# Patient Record
Sex: Female | Born: 1960 | Race: White | Hispanic: Yes | Marital: Married | Smoking: Never smoker
Health system: Southern US, Community
[De-identification: ages and names within clinical notes are randomized; demographics above are authoritative.]

## PROBLEM LIST (undated history)

## (undated) DIAGNOSIS — Z87442 Personal history of urinary calculi: Secondary | ICD-10-CM

## (undated) DIAGNOSIS — Z8742 Personal history of other diseases of the female genital tract: Secondary | ICD-10-CM

## (undated) DIAGNOSIS — K219 Gastro-esophageal reflux disease without esophagitis: Secondary | ICD-10-CM

## (undated) DIAGNOSIS — N200 Calculus of kidney: Secondary | ICD-10-CM

## (undated) DIAGNOSIS — Z9889 Other specified postprocedural states: Secondary | ICD-10-CM

## (undated) DIAGNOSIS — E785 Hyperlipidemia, unspecified: Secondary | ICD-10-CM

## (undated) DIAGNOSIS — R7309 Other abnormal glucose: Secondary | ICD-10-CM

## (undated) DIAGNOSIS — H409 Unspecified glaucoma: Secondary | ICD-10-CM

## (undated) HISTORY — DX: Personal history of urinary calculi: Z87.442

## (undated) HISTORY — DX: Other abnormal glucose: R73.09

## (undated) HISTORY — DX: Other specified postprocedural states: Z98.890

## (undated) HISTORY — DX: Unspecified glaucoma: H40.9

## (undated) HISTORY — PX: FRACTURE SURGERY: SHX138

## (undated) HISTORY — PX: NO PAST SURGERIES: SHX2092

## (undated) HISTORY — DX: Personal history of other diseases of the female genital tract: Z87.42

---

## 2005-03-05 ENCOUNTER — Other Ambulatory Visit: Admission: RE | Admit: 2005-03-05 | Discharge: 2005-03-05 | Payer: Self-pay | Admitting: Obstetrics and Gynecology

## 2011-10-05 ENCOUNTER — Emergency Department (HOSPITAL_BASED_OUTPATIENT_CLINIC_OR_DEPARTMENT_OTHER)
Admission: EM | Admit: 2011-10-05 | Discharge: 2011-10-05 | Disposition: A | Discharge status: Home / Self Care | Payer: BC Managed Care – PPO | Attending: Emergency Medicine | Admitting: Emergency Medicine

## 2011-10-05 ENCOUNTER — Encounter (HOSPITAL_BASED_OUTPATIENT_CLINIC_OR_DEPARTMENT_OTHER): Payer: Self-pay | Admitting: *Deleted

## 2011-10-05 ENCOUNTER — Emergency Department (INDEPENDENT_AMBULATORY_CARE_PROVIDER_SITE_OTHER): Payer: BC Managed Care – PPO

## 2011-10-05 DIAGNOSIS — N200 Calculus of kidney: Secondary | ICD-10-CM

## 2011-10-05 DIAGNOSIS — K219 Gastro-esophageal reflux disease without esophagitis: Secondary | ICD-10-CM | POA: Insufficient documentation

## 2011-10-05 DIAGNOSIS — N23 Unspecified renal colic: Secondary | ICD-10-CM

## 2011-10-05 DIAGNOSIS — N201 Calculus of ureter: Secondary | ICD-10-CM

## 2011-10-05 DIAGNOSIS — R109 Unspecified abdominal pain: Secondary | ICD-10-CM

## 2011-10-05 DIAGNOSIS — R112 Nausea with vomiting, unspecified: Secondary | ICD-10-CM | POA: Insufficient documentation

## 2011-10-05 DIAGNOSIS — E785 Hyperlipidemia, unspecified: Secondary | ICD-10-CM | POA: Insufficient documentation

## 2011-10-05 DIAGNOSIS — K449 Diaphragmatic hernia without obstruction or gangrene: Secondary | ICD-10-CM

## 2011-10-05 HISTORY — DX: Gastro-esophageal reflux disease without esophagitis: K21.9

## 2011-10-05 HISTORY — DX: Hyperlipidemia, unspecified: E78.5

## 2011-10-05 LAB — CBC
Hemoglobin: 12.6 g/dL (ref 12.0–15.0)
MCH: 29.6 pg (ref 26.0–34.0)
MCV: 86.4 fL (ref 78.0–100.0)
Platelets: 197 10*3/uL (ref 150–400)
RBC: 4.25 MIL/uL (ref 3.87–5.11)
WBC: 10.1 10*3/uL (ref 4.0–10.5)

## 2011-10-05 LAB — COMPREHENSIVE METABOLIC PANEL
ALT: 19 U/L (ref 0–35)
Alkaline Phosphatase: 60 U/L (ref 39–117)
BUN: 19 mg/dL (ref 6–23)
CO2: 26 mEq/L (ref 19–32)
GFR calc Af Amer: 75 mL/min — ABNORMAL LOW (ref >90)
GFR calc non Af Amer: 65 mL/min — ABNORMAL LOW (ref >90)
Glucose, Bld: 124 mg/dL — ABNORMAL HIGH (ref 70–99)
Potassium: 3.6 mEq/L (ref 3.5–5.1)
Sodium: 141 mEq/L (ref 135–145)

## 2011-10-05 LAB — LIPASE, BLOOD: Lipase: 27 U/L (ref 11–59)

## 2011-10-05 LAB — DIFFERENTIAL
Eosinophils Relative: 1 % (ref 0–5)
Lymphocytes Relative: 16 % (ref 12–46)
Lymphs Abs: 1.6 10*3/uL (ref 0.7–4.0)
Monocytes Relative: 6 % (ref 3–12)
Neutrophils Relative %: 77 % (ref 43–77)

## 2011-10-05 LAB — URINALYSIS, ROUTINE W REFLEX MICROSCOPIC
Bilirubin Urine: NEGATIVE
Glucose, UA: NEGATIVE mg/dL
Hgb urine dipstick: NEGATIVE
Urobilinogen, UA: 0.2 mg/dL (ref 0.0–1.0)
pH: 7.5 (ref 5.0–8.0)

## 2011-10-05 MED ORDER — ONDANSETRON HCL 4 MG/2ML IJ SOLN
4.0000 mg | Freq: Once | INTRAMUSCULAR | Status: AC
  Administered 2011-10-05: 4 mg via INTRAVENOUS

## 2011-10-05 MED ORDER — ONDANSETRON HCL 4 MG/2ML IJ SOLN
INTRAMUSCULAR | Status: AC
  Administered 2011-10-05: 4 mg via INTRAVENOUS
  Filled 2011-10-05: qty 2

## 2011-10-05 MED ORDER — ONDANSETRON 8 MG PO TBDP: 8.0000 mg | ORAL_TABLET | Freq: Three times a day (TID) | ORAL | Status: AC | PRN

## 2011-10-05 MED ORDER — IBUPROFEN 600 MG PO TABS: 600.0000 mg | ORAL_TABLET | Freq: Four times a day (QID) | ORAL | Status: AC | PRN

## 2011-10-05 MED ORDER — HYDROMORPHONE HCL PF 1 MG/ML IJ SOLN
1.0000 mg | Freq: Once | INTRAMUSCULAR | Status: AC
  Administered 2011-10-05: 1 mg via INTRAVENOUS

## 2011-10-05 MED ORDER — SODIUM CHLORIDE 0.9 % IV BOLUS (SEPSIS)
1000.0000 mL | Freq: Once | INTRAVENOUS | Status: AC
  Administered 2011-10-05: 1000 mL via INTRAVENOUS

## 2011-10-05 MED ORDER — ONDANSETRON HCL 4 MG/2ML IJ SOLN
4.0000 mg | Freq: Once | INTRAMUSCULAR | Status: AC
  Administered 2011-10-05: 4 mg via INTRAVENOUS
  Filled 2011-10-05: qty 2

## 2011-10-05 MED ORDER — HYDROMORPHONE HCL PF 1 MG/ML IJ SOLN
INTRAMUSCULAR | Status: AC
  Administered 2011-10-05: 1 mg via INTRAVENOUS
  Filled 2011-10-05: qty 1

## 2011-10-05 MED ORDER — OXYCODONE-ACETAMINOPHEN 5-325 MG PO TABS
1.0000 | ORAL_TABLET | Freq: Once | ORAL | Status: AC
  Administered 2011-10-05: 1 via ORAL
  Filled 2011-10-05: qty 1

## 2011-10-05 MED ORDER — OXYCODONE-ACETAMINOPHEN 5-325 MG PO TABS: 1.0000 | ORAL_TABLET | ORAL | Status: AC | PRN

## 2011-10-05 NOTE — ED Notes (Signed)
MD at bedside. 

## 2011-10-05 NOTE — ED Notes (Signed)
Pt ambulated to restroom without assist, specimen obtained.

## 2011-10-05 NOTE — ED Notes (Signed)
Pt ambulated to restroom per request.

## 2011-10-05 NOTE — ED Notes (Signed)
Pt c/o right side abdominal pain that came on all of a sudden 3 hours ago. Pt also c/o N/V but no diarrhea.

## 2011-10-05 NOTE — ED Provider Notes (Signed)
History     CSN: [id number]  Arrival date & time 10/05/11  0145   First MD Initiated Contact with Patient 10/05/11 0206      Chief Complaint  Patient presents with  . Abdominal Pain     Patient is a 51 y.o. female presenting with abdominal pain. The history is provided by the patient.  Abdominal Pain The primary symptoms of the illness include abdominal pain, nausea and vomiting. The primary symptoms of the illness do not include fever, diarrhea or vaginal bleeding. The current episode started 3 to 5 hours ago. The onset of the illness was sudden. The problem has been rapidly worsening.  Additional symptoms associated with the illness include chills, frequency and back pain.  Pt reports she was in bed when this pain occurred She has never had this pain before She denies h/o abd surgeries No cp/sob reported No fever reported The pain has been constant  Past Medical History  Diagnosis Date  . GERD (gastroesophageal reflux disease)   . Hyperlipidemia     History reviewed. No pertinent past surgical history.  No family history on file.  History  Substance Use Topics  . Smoking status: Never Smoker   . Smokeless tobacco: Not on file  . Alcohol Use: No    OB History    Grav Para Term Preterm Abortions TAB SAB Ect Mult Living                  Review of Systems  Constitutional: Positive for chills. Negative for fever.  Gastrointestinal: Positive for nausea, vomiting and abdominal pain. Negative for diarrhea.  Genitourinary: Positive for frequency. Negative for vaginal bleeding.  Musculoskeletal: Positive for back pain.  All other systems reviewed and are negative.    Allergies  Review of patient's allergies indicates no known allergies.  Home Medications   Current Outpatient Rx  Name Route Sig Dispense Refill  . ESOMEPRAZOLE MAGNESIUM 20 MG PO CPDR Oral Take 20 mg by mouth daily before breakfast.    . SIMVASTATIN 20 MG PO TABS Oral Take 20 mg by mouth every  evening.      BP 121/76  Pulse 72  Temp(Src) 98 F (36.7 C) (Oral)  Resp 20  SpO2 100%  Physical Exam CONSTITUTIONAL: Well developed/well nourished, uncomfortable appearing HEAD AND FACE: Normocephalic/atraumatic EYES: EOMI/PERRL, no scleral icterus ENMT: Mucous membranes moist NECK: supple no meningeal signs SPINE:entire spine nontender CV: S1/S2 noted, no murmurs/rubs/gallops noted LUNGS: Lungs are clear to auscultation bilaterally, no apparent distress ABDOMEN: soft, no rebound, no guarding is noted, tender in right mid abdomen, negative murphys sign GU:no cva tenderness NEURO: Pt is awake/alert, moves all extremitiesx4 EXTREMITIES: pulses normal, full ROM SKIN: warm, color normal PSYCH: no abnormalities of mood noted  ED Course  Procedures   Labs Reviewed  URINALYSIS, ROUTINE W REFLEX MICROSCOPIC - Abnormal; Notable for the following:    APPearance TURBID (*)    Ketones, ur 15 (*)    Leukocytes, UA SMALL (*)    All other components within normal limits  COMPREHENSIVE METABOLIC PANEL - Abnormal; Notable for the following:    Glucose, Bld 124 (*)    Total Bilirubin 0.2 (*)    GFR calc non Af Amer 65 (*)    GFR calc Af Amer 75 (*)    All other components within normal limits  URINE MICROSCOPIC-ADD ON - Abnormal; Notable for the following:    Squamous Epithelial / LPF FEW (*)    All other components within normal  limits  CBC  DIFFERENTIAL  LIPASE, BLOOD  PREGNANCY, URINE   3:56 AM After pain meds, pt now localizes pain to right flank with some radiation to lower abd Will get ct imaging  5:44 AM Pt improved ,taking PO, ambulatory Discussed course of illness Given urology followup Stable for d/c BP 103/63  Pulse 72  Temp(Src) 98 F (36.7 C) (Oral)  Resp 20  SpO2 98%   MDM  Nursing notes reviewed and considered in documentation All labs/vitals reviewed and considered         Joya Gaskins, MD 10/05/11 [phone]

## 2011-10-05 NOTE — ED Notes (Signed)
Patient transported to CT 

## 2011-10-05 NOTE — ED Notes (Signed)
Pt made aware that we need a urine sample.

## 2012-10-15 ENCOUNTER — Emergency Department (HOSPITAL_BASED_OUTPATIENT_CLINIC_OR_DEPARTMENT_OTHER): Payer: BC Managed Care – PPO

## 2012-10-15 ENCOUNTER — Emergency Department (HOSPITAL_BASED_OUTPATIENT_CLINIC_OR_DEPARTMENT_OTHER)
Admission: EM | Admit: 2012-10-15 | Discharge: 2012-10-15 | Disposition: A | Discharge status: Home / Self Care | Payer: BC Managed Care – PPO | Attending: Emergency Medicine | Admitting: Emergency Medicine

## 2012-10-15 ENCOUNTER — Encounter (HOSPITAL_BASED_OUTPATIENT_CLINIC_OR_DEPARTMENT_OTHER): Payer: Self-pay | Admitting: *Deleted

## 2012-10-15 DIAGNOSIS — Y929 Unspecified place or not applicable: Secondary | ICD-10-CM | POA: Insufficient documentation

## 2012-10-15 DIAGNOSIS — Z79899 Other long term (current) drug therapy: Secondary | ICD-10-CM | POA: Insufficient documentation

## 2012-10-15 DIAGNOSIS — E785 Hyperlipidemia, unspecified: Secondary | ICD-10-CM | POA: Insufficient documentation

## 2012-10-15 DIAGNOSIS — W108XXA Fall (on) (from) other stairs and steps, initial encounter: Secondary | ICD-10-CM | POA: Insufficient documentation

## 2012-10-15 DIAGNOSIS — K219 Gastro-esophageal reflux disease without esophagitis: Secondary | ICD-10-CM | POA: Insufficient documentation

## 2012-10-15 DIAGNOSIS — S42409A Unspecified fracture of lower end of unspecified humerus, initial encounter for closed fracture: Secondary | ICD-10-CM | POA: Insufficient documentation

## 2012-10-15 DIAGNOSIS — Y939 Activity, unspecified: Secondary | ICD-10-CM | POA: Insufficient documentation

## 2012-10-15 DIAGNOSIS — S42402A Unspecified fracture of lower end of left humerus, initial encounter for closed fracture: Secondary | ICD-10-CM

## 2012-10-15 MED ORDER — HYDROCODONE-ACETAMINOPHEN 5-325 MG PO TABS: 2.0000 | ORAL_TABLET | ORAL | Status: DC | PRN

## 2012-10-15 NOTE — ED Notes (Signed)
Patient transported to X-ray 

## 2012-10-15 NOTE — ED Notes (Signed)
Pt states she tripped this a.m and fell down 4-5 steps, injuring her left elbow. C/O pain to same. Moves fingers. Feels touch. Cap refill < 3 sec. Ring removed and given to husband. Arm placed in sling and ice applied.

## 2012-10-15 NOTE — ED Provider Notes (Signed)
History     CSN: [id number]  Arrival date & time 10/15/12  1103   First MD Initiated Contact with Patient 10/15/12 1207      Chief Complaint  Patient presents with  . Elbow Injury    (Consider location/radiation/quality/duration/timing/severity/associated sxs/prior treatment) Patient is a 52 y.o. female presenting with arm injury. The history is provided by the patient. No language interpreter was used.  Arm Injury Location:  Elbow Time since incident:  3 hours Injury: yes   Mechanism of injury: fall   Fall:    Fall occurred:  Down stairs   Impact surface:  Stairs   Entrapped after fall: no   Elbow location:  L elbow Pain details:    Quality:  Aching   Radiates to:  L elbow   Severity:  Mild   Onset quality:  Sudden   Timing:  Constant   Progression:  Improving Relieved by:  NSAIDs Worsened by:  Nothing tried Associated symptoms: no back pain and no neck pain     Past Medical History  Diagnosis Date  . GERD (gastroesophageal reflux disease)   . Hyperlipidemia     History reviewed. No pertinent past surgical history.  History reviewed. No pertinent family history.  History  Substance Use Topics  . Smoking status: Never Smoker   . Smokeless tobacco: Not on file  . Alcohol Use: No    OB History   Grav Para Term Preterm Abortions TAB SAB Ect Mult Living                  Review of Systems  HENT: Negative for neck pain.   Musculoskeletal: Positive for joint swelling. Negative for back pain.  All other systems reviewed and are negative.    Allergies  Review of patient's allergies indicates no known allergies.  Home Medications   Current Outpatient Rx  Name  Route  Sig  Dispense  Refill  . esomeprazole (NEXIUM) 20 MG capsule   Oral   Take 20 mg by mouth daily before breakfast.         . simvastatin (ZOCOR) 20 MG tablet   Oral   Take 20 mg by mouth every evening.           BP 107/68  Pulse 62  Temp(Src) 97.9 F (36.6 C) (Oral)  Resp  20  Ht 5\' 1"  (1.549 m)  Wt 143 lb (64.864 kg)  BMI 27.03 kg/m2  SpO2 100%  Physical Exam  Nursing note and vitals reviewed. Constitutional: She appears well-developed and well-nourished.  HENT:  Head: Normocephalic.  Eyes: Pupils are equal, round, and reactive to light.  Neck: Normal range of motion.  Cardiovascular: Normal rate.   Pulmonary/Chest: Effort normal.  Musculoskeletal: She exhibits tenderness.  Tender swollen left elbow,  Decreased range of motion,  nv and ns intact  Neurological: She is alert.  Skin: Skin is warm.  Psychiatric: She has a normal mood and affect.    ED Course  Procedures (including critical care time)  Labs Reviewed - No data to display Dg Elbow Complete Left  10/15/2012  *RADIOLOGY REPORT*  Clinical Data: Larey Seat today with posterior elbow pain  LEFT ELBOW - COMPLETE 3+ VIEW  Comparison: None.  Findings: There is a minimally distracted fracture of the medial epicondyle of the distal left humerus with soft tissue swelling. The joint spaces appear normal.  There may be a small left elbow joint effusion present.  IMPRESSION: Slightly distracted fracture of the medial epicondyle.  Small left  elbow joint effusion.   Original Report Authenticated By: Dwyane Dee, M.D.      1. Elbow fracture, left       MDM  Pt counseled on xray,   I advised follow up with Dr. Melvyn Novas for evaluation.   Call tomorrow for appointment.        [place], [place] 10/15/12 1249

## 2012-10-16 ENCOUNTER — Ambulatory Visit
Admission: RE | Admit: 2012-10-16 | Discharge: 2012-10-16 | Disposition: A | Discharge status: Home / Self Care | Payer: BC Managed Care – PPO | Source: Ambulatory Visit | Attending: Orthopedic Surgery | Admitting: Orthopedic Surgery

## 2012-10-16 ENCOUNTER — Other Ambulatory Visit: Payer: Self-pay | Admitting: Orthopedic Surgery

## 2012-10-16 DIAGNOSIS — S42409A Unspecified fracture of lower end of unspecified humerus, initial encounter for closed fracture: Secondary | ICD-10-CM

## 2012-10-16 NOTE — ED Provider Notes (Signed)
Medical screening examination/treatment/procedure(s) were performed by non-physician practitioner and as supervising physician I was immediately available for consultation/collaboration.   Carleene Cooper III, MD 10/16/12 1302

## 2012-10-18 ENCOUNTER — Other Ambulatory Visit: Payer: Self-pay | Admitting: Orthopedic Surgery

## 2012-10-18 ENCOUNTER — Encounter (HOSPITAL_COMMUNITY): Payer: Self-pay

## 2012-10-20 ENCOUNTER — Encounter (HOSPITAL_COMMUNITY)
Admission: RE | Admit: 2012-10-20 | Discharge: 2012-10-20 | Disposition: A | Discharge status: Home / Self Care | Payer: BC Managed Care – PPO | Source: Ambulatory Visit | Attending: Orthopedic Surgery | Admitting: Orthopedic Surgery

## 2012-10-20 ENCOUNTER — Encounter (HOSPITAL_COMMUNITY): Payer: Self-pay

## 2012-10-20 LAB — CBC
HCT: 36 % (ref 36.0–46.0)
Hemoglobin: 12.2 g/dL (ref 12.0–15.0)
MCH: 29.8 pg (ref 26.0–34.0)
MCHC: 33.9 g/dL (ref 30.0–36.0)
RDW: 13 % (ref 11.5–15.5)

## 2012-10-20 LAB — BASIC METABOLIC PANEL
BUN: 15 mg/dL (ref 6–23)
Calcium: 9.7 mg/dL (ref 8.4–10.5)
Chloride: 103 mEq/L (ref 96–112)
Creatinine, Ser: 0.67 mg/dL (ref 0.50–1.10)
GFR calc Af Amer: >90 mL/min (ref >90)

## 2012-10-20 LAB — SURGICAL PCR SCREEN
MRSA, PCR: NEGATIVE
Staphylococcus aureus: NEGATIVE

## 2012-10-20 MED ORDER — CHLORHEXIDINE GLUCONATE 4 % EX LIQD: 60.0000 mL | Freq: Once | CUTANEOUS | Status: DC

## 2012-10-20 MED ORDER — SODIUM CHLORIDE 0.45 % IV SOLN: INTRAVENOUS | Status: DC

## 2012-10-20 MED ORDER — CEFAZOLIN SODIUM-DEXTROSE 2-3 GM-% IV SOLR
2.0000 g | INTRAVENOUS | Status: AC
  Administered 2012-10-21: 2 g via INTRAVENOUS
  Filled 2012-10-20: qty 50

## 2012-10-20 NOTE — Pre-Procedure Instructions (Signed)
Delsa Hilst Shisler  10/20/2012   Your procedure is scheduled on:  10/21/12  Report to [facility] at 730 AM.  Call this number if you have problems the morning of surgery: [phone]   Remember:   Do not eat food or drink liquids after midnight.   Take these medicines the morning of surgery with A SIP OF WATER: none   Do not wear jewelry, make-up or nail polish.  Do not wear lotions, powders, or perfumes. You may wear deodorant.  Do not shave 48 hours prior to surgery. Men may shave face and neck.  Do not bring valuables to the hospital.  Contacts, dentures or bridgework may not be worn into surgery.  Leave suitcase in the car. After surgery it may be brought to your room.  For patients admitted to the hospital, checkout time is 11:00 AM the day of  discharge.   Patients discharged the day of surgery will not be allowed to drive  home.  Name and phone number of your driver: family  Special Instructions: Shower using CHG 2 nights before surgery and the night before surgery.  If you shower the day of surgery use CHG.  Use special wash - you have one bottle of CHG for all showers.  You should use approximately 1/3 of the bottle for each shower.   Please read over the following fact sheets that you were given: Pain Booklet, Coughing and Deep Breathing, MRSA Information and Surgical Site Infection Prevention

## 2012-10-21 ENCOUNTER — Encounter (HOSPITAL_COMMUNITY)
Admission: RE | Disposition: A | Discharge status: Home / Self Care | Payer: Self-pay | Source: Ambulatory Visit | Attending: Orthopedic Surgery

## 2012-10-21 ENCOUNTER — Ambulatory Visit (HOSPITAL_COMMUNITY)
Admission: RE | Admit: 2012-10-21 | Discharge: 2012-10-22 | Disposition: A | Discharge status: Home / Self Care | Payer: BC Managed Care – PPO | Source: Ambulatory Visit | Attending: Orthopedic Surgery | Admitting: Orthopedic Surgery

## 2012-10-21 ENCOUNTER — Ambulatory Visit (HOSPITAL_COMMUNITY): Payer: BC Managed Care – PPO | Admitting: Anesthesiology

## 2012-10-21 ENCOUNTER — Encounter (HOSPITAL_COMMUNITY): Payer: Self-pay | Admitting: Anesthesiology

## 2012-10-21 ENCOUNTER — Encounter (HOSPITAL_COMMUNITY): Payer: Self-pay | Admitting: Surgery

## 2012-10-21 DIAGNOSIS — Z01812 Encounter for preprocedural laboratory examination: Secondary | ICD-10-CM | POA: Insufficient documentation

## 2012-10-21 DIAGNOSIS — S42402A Unspecified fracture of lower end of left humerus, initial encounter for closed fracture: Secondary | ICD-10-CM

## 2012-10-21 DIAGNOSIS — E785 Hyperlipidemia, unspecified: Secondary | ICD-10-CM | POA: Insufficient documentation

## 2012-10-21 DIAGNOSIS — S42463A Displaced fracture of medial condyle of unspecified humerus, initial encounter for closed fracture: Secondary | ICD-10-CM | POA: Insufficient documentation

## 2012-10-21 DIAGNOSIS — K219 Gastro-esophageal reflux disease without esophagitis: Secondary | ICD-10-CM | POA: Insufficient documentation

## 2012-10-21 DIAGNOSIS — X58XXXA Exposure to other specified factors, initial encounter: Secondary | ICD-10-CM | POA: Insufficient documentation

## 2012-10-21 HISTORY — PX: ORIF ULNAR FRACTURE: SHX5417

## 2012-10-21 SURGERY — OPEN REDUCTION INTERNAL FIXATION (ORIF) ULNAR FRACTURE
Anesthesia: Regional | Site: Elbow | Laterality: Left | Wound class: Clean

## 2012-10-21 MED ORDER — CEFAZOLIN SODIUM 1-5 GM-% IV SOLN
1.0000 g | Freq: Three times a day (TID) | INTRAVENOUS | Status: DC
  Administered 2012-10-21 – 2012-10-22 (×2): 1 g via INTRAVENOUS
  Filled 2012-10-21 (×5): qty 50

## 2012-10-21 MED ORDER — SENNA 8.6 MG PO TABS
1.0000 | ORAL_TABLET | Freq: Two times a day (BID) | ORAL | Status: DC
  Administered 2012-10-22: 8.6 mg via ORAL
  Filled 2012-10-21 (×3): qty 1

## 2012-10-21 MED ORDER — CEFAZOLIN SODIUM 1-5 GM-% IV SOLN
1.0000 g | INTRAVENOUS | Status: AC
  Administered 2012-10-21: 1 g via INTRAVENOUS

## 2012-10-21 MED ORDER — VITAMIN C 500 MG PO TABS
1000.0000 mg | ORAL_TABLET | Freq: Every day | ORAL | Status: DC
  Administered 2012-10-21 – 2012-10-22 (×2): 1000 mg via ORAL
  Filled 2012-10-21 (×2): qty 2

## 2012-10-21 MED ORDER — OXYCODONE HCL 5 MG PO TABS: 5.0000 mg | ORAL_TABLET | Freq: Once | ORAL | Status: DC | PRN

## 2012-10-21 MED ORDER — METHOCARBAMOL 500 MG PO TABS
500.0000 mg | ORAL_TABLET | Freq: Four times a day (QID) | ORAL | Status: DC | PRN
  Administered 2012-10-21 – 2012-10-22 (×3): 500 mg via ORAL
  Filled 2012-10-21 (×3): qty 1

## 2012-10-21 MED ORDER — CEFAZOLIN SODIUM 1-5 GM-% IV SOLN
INTRAVENOUS | Status: AC
  Filled 2012-10-21: qty 50

## 2012-10-21 MED ORDER — OXYCODONE HCL 5 MG PO TABS
5.0000 mg | ORAL_TABLET | ORAL | Status: DC | PRN
  Administered 2012-10-22 (×3): 10 mg via ORAL
  Filled 2012-10-21 (×3): qty 2

## 2012-10-21 MED ORDER — OXYCODONE HCL 5 MG/5ML PO SOLN: 5.0000 mg | Freq: Once | ORAL | Status: DC | PRN

## 2012-10-21 MED ORDER — DEXTROSE 5 % IV SOLN
500.0000 mg | Freq: Four times a day (QID) | INTRAVENOUS | Status: DC | PRN
  Filled 2012-10-21: qty 5

## 2012-10-21 MED ORDER — DEXTROSE 5 % IV SOLN
INTRAVENOUS | Status: DC | PRN
  Administered 2012-10-21: 10:00:00 via INTRAVENOUS

## 2012-10-21 MED ORDER — ONDANSETRON HCL 4 MG PO TABS: 4.0000 mg | ORAL_TABLET | Freq: Four times a day (QID) | ORAL | Status: DC | PRN

## 2012-10-21 MED ORDER — MORPHINE SULFATE 2 MG/ML IJ SOLN
1.0000 mg | INTRAMUSCULAR | Status: DC | PRN
  Administered 2012-10-22: 1 mg via INTRAVENOUS
  Filled 2012-10-21: qty 1

## 2012-10-21 MED ORDER — SIMVASTATIN 20 MG PO TABS
20.0000 mg | ORAL_TABLET | Freq: Every evening | ORAL | Status: DC
  Administered 2012-10-21: 20 mg via ORAL
  Filled 2012-10-21 (×2): qty 1

## 2012-10-21 MED ORDER — PHENYLEPHRINE HCL 10 MG/ML IJ SOLN
INTRAMUSCULAR | Status: DC | PRN
  Administered 2012-10-21 (×2): 80 ug via INTRAVENOUS

## 2012-10-21 MED ORDER — LACTATED RINGERS IV SOLN
INTRAVENOUS | Status: DC | PRN
  Administered 2012-10-21 (×2): via INTRAVENOUS

## 2012-10-21 MED ORDER — LIDOCAINE HCL (CARDIAC) 20 MG/ML IV SOLN
INTRAVENOUS | Status: DC | PRN
  Administered 2012-10-21: 20 mg via INTRAVENOUS

## 2012-10-21 MED ORDER — FAMOTIDINE 20 MG PO TABS
20.0000 mg | ORAL_TABLET | Freq: Two times a day (BID) | ORAL | Status: DC | PRN
  Filled 2012-10-21: qty 1

## 2012-10-21 MED ORDER — BUPIVACAINE HCL (PF) 0.25 % IJ SOLN
INTRAMUSCULAR | Status: DC | PRN
  Administered 2012-10-21: 8 mL

## 2012-10-21 MED ORDER — FENTANYL CITRATE 0.05 MG/ML IJ SOLN
INTRAMUSCULAR | Status: DC | PRN
  Administered 2012-10-21: 100 ug via INTRAVENOUS

## 2012-10-21 MED ORDER — HYDROMORPHONE HCL PF 1 MG/ML IJ SOLN
INTRAMUSCULAR | Status: AC
  Filled 2012-10-21: qty 1

## 2012-10-21 MED ORDER — ALPRAZOLAM 0.5 MG PO TABS: 0.5000 mg | ORAL_TABLET | Freq: Four times a day (QID) | ORAL | Status: DC | PRN

## 2012-10-21 MED ORDER — ONDANSETRON HCL 4 MG/2ML IJ SOLN
INTRAMUSCULAR | Status: DC | PRN
  Administered 2012-10-21: 4 mg via INTRAVENOUS

## 2012-10-21 MED ORDER — PROPOFOL 10 MG/ML IV EMUL
INTRAVENOUS | Status: DC | PRN
  Administered 2012-10-21: 150 mL via INTRAVENOUS

## 2012-10-21 MED ORDER — HYDROMORPHONE HCL PF 1 MG/ML IJ SOLN
0.2500 mg | INTRAMUSCULAR | Status: DC | PRN
  Administered 2012-10-21 (×2): 0.5 mg via INTRAVENOUS

## 2012-10-21 MED ORDER — DIPHENHYDRAMINE HCL 25 MG PO CAPS: 25.0000 mg | ORAL_CAPSULE | Freq: Four times a day (QID) | ORAL | Status: DC | PRN

## 2012-10-21 MED ORDER — SODIUM CHLORIDE 0.45 % IV SOLN
INTRAVENOUS | Status: DC
  Administered 2012-10-21: 22:00:00 via INTRAVENOUS

## 2012-10-21 MED ORDER — FENTANYL CITRATE 0.05 MG/ML IJ SOLN: 50.0000 ug | INTRAMUSCULAR | Status: DC | PRN

## 2012-10-21 MED ORDER — PROMETHAZINE HCL 25 MG RE SUPP: 12.5000 mg | Freq: Four times a day (QID) | RECTAL | Status: DC | PRN

## 2012-10-21 MED ORDER — MIDAZOLAM HCL 5 MG/5ML IJ SOLN
INTRAMUSCULAR | Status: DC | PRN
  Administered 2012-10-21: 2 mg via INTRAVENOUS

## 2012-10-21 MED ORDER — BUPIVACAINE-EPINEPHRINE PF 0.5-1:200000 % IJ SOLN
INTRAMUSCULAR | Status: DC | PRN
  Administered 2012-10-21: 30 mL

## 2012-10-21 MED ORDER — MIDAZOLAM HCL 2 MG/2ML IJ SOLN: 0.5000 mg | INTRAMUSCULAR | Status: DC | PRN

## 2012-10-21 MED ORDER — ONDANSETRON HCL 4 MG/2ML IJ SOLN
4.0000 mg | Freq: Four times a day (QID) | INTRAMUSCULAR | Status: DC | PRN
  Administered 2012-10-21 – 2012-10-22 (×2): 4 mg via INTRAVENOUS
  Filled 2012-10-21 (×3): qty 2

## 2012-10-21 SURGICAL SUPPLY — 58 items
BANDAGE ELASTIC 3 VELCRO ST LF (GAUZE/BANDAGES/DRESSINGS) ×2 IMPLANT
BANDAGE ELASTIC 4 VELCRO ST LF (GAUZE/BANDAGES/DRESSINGS) ×2 IMPLANT
BANDAGE GAUZE ELAST BULKY 4 IN (GAUZE/BANDAGES/DRESSINGS) ×2 IMPLANT
BIT DRILL MINI LNG ACUTRAK 2 (BIT) IMPLANT
BLADE SURG ROTATE 9660 (MISCELLANEOUS) IMPLANT
BNDG CMPR 9X4 STRL LF SNTH (GAUZE/BANDAGES/DRESSINGS) ×1
BNDG ESMARK 4X9 LF (GAUZE/BANDAGES/DRESSINGS) ×2 IMPLANT
CLOTH BEACON ORANGE TIMEOUT ST (SAFETY) ×2 IMPLANT
CORDS BIPOLAR (ELECTRODE) ×2 IMPLANT
COVER SURGICAL LIGHT HANDLE (MISCELLANEOUS) ×2 IMPLANT
CUFF TOURNIQUET SINGLE 18IN (TOURNIQUET CUFF) ×2 IMPLANT
CUFF TOURNIQUET SINGLE 24IN (TOURNIQUET CUFF) IMPLANT
DECANTER SPIKE VIAL GLASS SM (MISCELLANEOUS) IMPLANT
DRAIN TLS ROUND 10FR (DRAIN) IMPLANT
DRAPE INCISE IOBAN 66X45 STRL (DRAPES) IMPLANT
DRAPE OEC MINIVIEW 54X84 (DRAPES) IMPLANT
DRAPE U-SHAPE 47X51 STRL (DRAPES) ×2 IMPLANT
DRILL MINI LNG ACUTRAK 2 (BIT) ×2
DRSG EMULSION OIL 3X3 NADH (GAUZE/BANDAGES/DRESSINGS) ×1 IMPLANT
ELECT REM PT RETURN 9FT ADLT (ELECTROSURGICAL)
ELECTRODE REM PT RTRN 9FT ADLT (ELECTROSURGICAL) IMPLANT
GAUZE XEROFORM 1X8 LF (GAUZE/BANDAGES/DRESSINGS) ×2 IMPLANT
GLOVE ORTHO TXT STRL SZ7.5 (GLOVE) ×2 IMPLANT
GLOVE SS BIOGEL STRL SZ 8 (GLOVE) ×1 IMPLANT
GLOVE SUPERSENSE BIOGEL SZ 8 (GLOVE) ×1
GOWN PREVENTION PLUS XLARGE (GOWN DISPOSABLE) ×2 IMPLANT
GOWN STRL NON-REIN LRG LVL3 (GOWN DISPOSABLE) ×6 IMPLANT
GOWN STRL REIN XL XLG (GOWN DISPOSABLE) ×4 IMPLANT
GUIDEWIRE ORTHO MINI ACTK .045 (WIRE) ×3 IMPLANT
KIT BASIN OR (CUSTOM PROCEDURE TRAY) ×2 IMPLANT
KIT ROOM TURNOVER OR (KITS) ×2 IMPLANT
LOOP VESSEL MAXI BLUE (MISCELLANEOUS) ×2 IMPLANT
MANIFOLD NEPTUNE II (INSTRUMENTS) ×2 IMPLANT
NDL BLUNT 16X1.5 OR ONLY (NEEDLE) IMPLANT
NEEDLE 22X1 1/2 (OR ONLY) (NEEDLE) IMPLANT
NEEDLE BLUNT 16X1.5 OR ONLY (NEEDLE) IMPLANT
NS IRRIG 1000ML POUR BTL (IV SOLUTION) ×2 IMPLANT
PACK ORTHO EXTREMITY (CUSTOM PROCEDURE TRAY) ×2 IMPLANT
PAD ARMBOARD 7.5X6 YLW CONV (MISCELLANEOUS) ×4 IMPLANT
PAD CAST 4YDX4 CTTN HI CHSV (CAST SUPPLIES) ×1 IMPLANT
PADDING CAST COTTON 4X4 STRL (CAST SUPPLIES) ×2
SCREW ACUTRAK 2 MINI 20MM (Screw) ×2 IMPLANT
SPLINT FIBERGLASS 4X30 (CAST SUPPLIES) ×1 IMPLANT
SPONGE GAUZE 4X4 12PLY (GAUZE/BANDAGES/DRESSINGS) ×2 IMPLANT
SPONGE LAP 4X18 X RAY DECT (DISPOSABLE) ×2 IMPLANT
STAPLER VISISTAT 35W (STAPLE) IMPLANT
SUCTION FRAZIER TIP 10 FR DISP (SUCTIONS) ×2 IMPLANT
SUT ETHILON 4 0 PS 2 18 (SUTURE) IMPLANT
SUT PROLENE 4 0 PS 2 18 (SUTURE) IMPLANT
SUT VIC AB 3-0 FS2 27 (SUTURE) IMPLANT
SYR CONTROL 10ML LL (SYRINGE) IMPLANT
SYSTEM CHEST DRAIN TLS 7FR (DRAIN) IMPLANT
TOWEL OR 17X24 6PK STRL BLUE (TOWEL DISPOSABLE) ×2 IMPLANT
TOWEL OR 17X26 10 PK STRL BLUE (TOWEL DISPOSABLE) ×2 IMPLANT
TUBE CONNECTING 12X1/4 (SUCTIONS) ×2 IMPLANT
TUBE EVACUATION TLS (MISCELLANEOUS) ×2 IMPLANT
WATER STERILE IRR 1000ML POUR (IV SOLUTION) ×2 IMPLANT
YANKAUER SUCT BULB TIP NO VENT (SUCTIONS) IMPLANT

## 2012-10-21 NOTE — Anesthesia Procedure Notes (Addendum)
Anesthesia Regional Block:  Supraclavicular block  Pre-Anesthetic Checklist: ,, timeout performed, Correct Patient, Correct Site, Correct Laterality, Correct Procedure, Correct Position, site marked, Risks and benefits discussed, pre-op evaluation, post-op pain management  Laterality: Left  Prep: Maximum Sterile Barrier Precautions used and chloraprep       Needles:  Injection technique: Single-shot  Needle Type: Echogenic Stimulator Needle     Needle Length: 5cm 5 cm Needle Gauge: 22 and 22 G    Additional Needles:  Procedures: ultrasound guided (picture in chart) Supraclavicular block Narrative:  Start time: 10/21/2012 9:15 AM End time: 10/21/2012 9:25 AM Injection made incrementally with aspirations every 5 mL. Anesthesiologist: Fitzgerald,MD  Additional Notes: 2% Lidocaine skin wheel. Intercostobrachial block with 8cc of 0.25% Bupivicaine plain.  Supraclavicular block Procedure Name: LMA Insertion Date/Time: 10/21/2012 10:06 AM Performed by: Tyrone Nine Pre-anesthesia Checklist: Patient identified, Timeout performed, Emergency Drugs available, Suction available and Patient being monitored Patient Re-evaluated:Patient Re-evaluated prior to inductionOxygen Delivery Method: Circle system utilized Preoxygenation: Pre-oxygenation with 100% oxygen Intubation Type: IV induction Ventilation: Mask ventilation without difficulty LMA: LMA with gastric port inserted LMA Size: 4.0 Number of attempts: 1 Placement Confirmation: breath sounds checked- equal and bilateral and positive ETCO2 Tube secured with: Tape Dental Injury: Teeth and Oropharynx as per pre-operative assessment

## 2012-10-21 NOTE — Op Note (Signed)
NAMECYNTHIA, Loretta Hodges              ACCOUNT NO.:  [id number]  MEDICAL RECORD NO.:  [id number]  LOCATION:  5N09C                        FACILITY:  [facility]  PHYSICIAN:  Dionne Ano. Dayelin Balducci, M.D.DATE OF BIRTH:  09-08-1960  DATE OF PROCEDURE: DATE OF DISCHARGE:                              OPERATIVE REPORT   PREOPERATIVE DIAGNOSIS:  Displaced medial epicondylar fracture, left elbow (intra-articular left elbow fracture distal humerus).  POSTOPERATIVE DIAGNOSIS:  Displaced medial epicondylar fracture, left elbow (intra-articular left elbow fracture distal humerus).  PROCEDURE: 1. Open reduction and internal fixation distal humerus fracture     (medial epicondylar fracture with 2 Acutrak screws. 2. Ulnar nerve release at the elbow. 3. Stress radiography.  SURGEON:  Dionne Ano. Amanda Pea, M.D.  ASSISTANT:  Karie Chimera, P.A.-C.  COMPLICATIONS:  None.  DRAINS:  None.  ESTIMATED BLOOD LOSS:  Minimal.  INDICATIONS:  The patient is pleasant female who presents with the above- mentioned diagnosis.  I have counseled her in regards to risks, benefits of surgery including risk of infection, bleeding, anesthesia, damage to normal structures, and failure of surgery to accomplish its intended goals of relieving symptoms and restoring function.  With this in mind, she desires to proceed.  All questions have been encouraged and answered preoperatively.  OPERATIVE PROCEDURE:  The patient was seen by myself and Anesthesia, taken to the operative suite, and underwent smooth induction of general anesthetic.  Preoperatively, a block was placed by Dr. Sampson Goon.  She was prepped and draped in the usual sterile fashion with Betadine scrub and paint.  Following this, final time-out was called.  Pre and postop check was completed and sterile field secured.  Arm was outlined and a medial incision was made about the elbow.  Dissection was carried down carefully to the fascia protecting the medial  antebrachial cutaneous nerve branches.  Following this, I performed a in situ ulnar nerve release.  The arcade of Struthers, medial intermuscular septum, 2 heads of the FCU and the cubital tunnel as well as Osborne's ligament was released.  Following this, I then identified the area just above this plane where I could enter into the fracture site.  I did so very carefully and identified the fracture site, taking care to keep attached the flexor pronator mass.  At this time, I then performed curettage irrigation and provisional reduction followed by placement of 2 Acutrak screws mini in nature, 20 mm in length.  This was done through standard technique according to the an Acutrak mini fracture.  I had good purchase, I had divergent screws and was pleased with the coaptation of the cancellous surfaces under compression.  X-rays revealed adequate position.  I stayed out of the olecranon fossa. I had full range of motion on the table.  There were no complicating features.  Identified that the ulnar nerve did not sublux and all looked quite well.  The patient tolerated this well.  There were no complicating features.  All areas were thoroughly reviewed and looked at prior to closure.  I closed the wound with 3-0 Vicryl followed by subcuticular Prolene and Steri-Strips.  Long-arm splint was placed without difficulty.  She will be monitored in recovery room.  We will plan  for Keflex as well as, pain management, vitamin C, Peri-Colace, algorithm.  Return to see [place] in the office in 10 to 14 days.  It was an absolute pleasure to participate in her care.  We look forward to participate in her postop recovery.  There was no complicating features with her surgery and the ulnar nerve looked quite well at the conclusion of the case.     Dionne Ano. Amanda Pea, M.D.     [facility]  D:  10/21/2012  T:  10/21/2012  Job:  [id number]

## 2012-10-21 NOTE — Anesthesia Postprocedure Evaluation (Signed)
  Anesthesia Post-op Note  Patient: Loretta Hodges  Procedure(s) Performed: Procedure(s) with comments: OPEN REDUCTION INTERNAL FIXATION left elbow medial epicondyle and ulnar nerve release  (Left) - OPEN REDUCTION INTERNAL FIXATION left elbow medial epicondyle and ulnar nerve release   Patient Location: PACU  Anesthesia Type:GA combined with regional for post-op pain  Level of Consciousness: awake and alert   Airway and Oxygen Therapy: Patient Spontanous Breathing  Post-op Pain: mild  Post-op Assessment: Post-op Vital signs reviewed, Patient's Cardiovascular Status Stable, Respiratory Function Stable, Patent Airway and No signs of Nausea or vomiting  Post-op Vital Signs: Reviewed and stable  Complications: No apparent anesthesia complications

## 2012-10-21 NOTE — Transfer of Care (Signed)
Immediate Anesthesia Transfer of Care Note  Patient: Loretta Hodges  Procedure(s) Performed: Procedure(s) with comments: OPEN REDUCTION INTERNAL FIXATION left elbow medial epicondyle and ulnar nerve release  (Left) - OPEN REDUCTION INTERNAL FIXATION left elbow medial epicondyle and ulnar nerve release   Patient Location: PACU  Anesthesia Type:General  Level of Consciousness: awake, alert , oriented and patient cooperative  Airway & Oxygen Therapy: Patient Spontanous Breathing and Patient connected to nasal cannula oxygen  Post-op Assessment: Report given to PACU RN and Post -op Vital signs reviewed and stable  Post vital signs: Reviewed and stable  Complications: No apparent anesthesia complications

## 2012-10-21 NOTE — Preoperative (Signed)
Beta Blockers   Reason not to administer Beta Blockers:Not Applicable 

## 2012-10-21 NOTE — Anesthesia Preprocedure Evaluation (Addendum)
Anesthesia Evaluation  Patient identified by MRN, date of birth, ID band Patient awake    Reviewed: Allergy & Precautions, H&P , NPO status , Patient's Chart, lab work & pertinent test results  Airway Mallampati: II TM Distance: >3 FB Neck ROM: Full    Dental no notable dental hx. (+) Teeth Intact and Dental Advisory Given   Pulmonary neg pulmonary ROS,  breath sounds clear to auscultation  Pulmonary exam normal       Cardiovascular Exercise Tolerance: Good negative cardio ROS  Rhythm:Regular Rate:Normal     Neuro/Psych negative neurological ROS  negative psych ROS   GI/Hepatic negative GI ROS, Neg liver ROS,   Endo/Other  negative endocrine ROS  Renal/GU negative Renal ROS  negative genitourinary   Musculoskeletal negative musculoskeletal ROS (+)   Abdominal   Peds  Hematology negative hematology ROS (+)   Anesthesia Other Findings   Reproductive/Obstetrics negative OB ROS                          Anesthesia Physical Anesthesia Plan  ASA: II  Anesthesia Plan: General and Regional   Post-op Pain Management:    Induction: Intravenous  Airway Management Planned: LMA  Additional Equipment:   Intra-op Plan:   Post-operative Plan: Extubation in OR  Informed Consent: I have reviewed the patients History and Physical, chart, labs and discussed the procedure including the risks, benefits and alternatives for the proposed anesthesia with the patient or authorized representative who has indicated his/her understanding and acceptance.   Dental advisory given  Plan Discussed with: CRNA  Anesthesia Plan Comments:         Anesthesia Quick Evaluation

## 2012-10-21 NOTE — H&P (Signed)
Loretta Hodges is an 52 y.o. female.   Chief Complaint: Patient presents for Left elbow ORIF and repair as necessary HPI: Marland KitchenMarland KitchenPatient presents for evaluation and treatment of the of their upper extremity predicament. The patient denies neck back chest or of abdominal pain. The patient notes that they have no lower extremity problems. The patient from primarily complains of the upper extremity pain noted.  Past Medical History  Diagnosis Date  . GERD (gastroesophageal reflux disease)   . Hyperlipidemia   . Medical history non-contributory     Past Surgical History  Procedure Laterality Date  . No past surgeries      History reviewed. No pertinent family history. Social History:  reports that she has never smoked. She does not have any smokeless tobacco history on file. She reports that  drinks alcohol. She reports that she does not use illicit drugs.  Allergies: No Known Allergies  Medications Prior to Admission  Medication Sig Dispense Refill  . simvastatin (ZOCOR) 20 MG tablet Take 20 mg by mouth every evening.        Results for orders placed during the hospital encounter of 10/20/12 (from the past 48 hour(s))  SURGICAL PCR SCREEN     Status: None   Collection Time    10/20/12  8:18 AM      Result Value Range   MRSA, PCR NEGATIVE  NEGATIVE   Staphylococcus aureus NEGATIVE  NEGATIVE   Comment:            The Xpert SA Assay (FDA     approved for NASAL specimens     in patients over 10 years of age),     is one component of     a comprehensive surveillance     program.  Test performance has     been validated by [employer] for patients greater     than or equal to 73 year old.     It is not intended     to diagnose infection nor to     guide or monitor treatment.  CBC     Status: None   Collection Time    10/20/12  8:19 AM      Result Value Range   WBC 5.3  4.0 - 10.5 K/uL   RBC 4.09  3.87 - 5.11 MIL/uL   Hemoglobin 12.2  12.0 - 15.0 g/dL   HCT 16.1  09.6  - 04.5 %   MCV 88.0  78.0 - 100.0 fL   MCH 29.8  26.0 - 34.0 pg   MCHC 33.9  30.0 - 36.0 g/dL   RDW 40.9  81.1 - 91.4 %   Platelets 182  150 - 400 K/uL  BASIC METABOLIC PANEL     Status: None   Collection Time    10/20/12  8:19 AM      Result Value Range   Sodium 136  135 - 145 mEq/L   Potassium 4.1  3.5 - 5.1 mEq/L   Chloride 103  96 - 112 mEq/L   CO2 27  19 - 32 mEq/L   Glucose, Bld 90  70 - 99 mg/dL   BUN 15  6 - 23 mg/dL   Creatinine, Ser 7.82  0.50 - 1.10 mg/dL   Calcium 9.7  8.4 - 95.6 mg/dL   GFR calc non Af Amer >90  >90 mL/min   GFR calc Af Amer >90  >90 mL/min   Comment:  The eGFR has been calculated     using the CKD EPI equation.     This calculation has not been     validated in all clinical     situations.     eGFR's persistently     <90 mL/min signify     possible Chronic Kidney Disease.   No results found.  Review of Systems  Constitutional: Negative.   HENT: Negative.   Eyes: Negative.   Respiratory: Negative.   Cardiovascular: Negative.   Gastrointestinal: Negative.   Genitourinary: Negative.   Skin: Negative.   Neurological: Negative.   Endo/Heme/Allergies: Negative.     Blood pressure 127/83, pulse 68, temperature 98.1 F (36.7 C), temperature source Oral, resp. rate 18, SpO2 97.00%. Physical Exam ..The patient is alert and oriented in no acute distress the patient complains of pain in the affected upper extremity. The patient is noted to have a normal HEENT exam. Lung fields show equal chest expansion and no shortness of breath abdomen exam is nontender without distention. Lower extremity examination does not show any fracture dislocation or blood clot symptoms. Pelvis is stable neck and back are stable and nontender  Assessment/Plan .Marland KitchenWe are planning surgery for your upper extremity. The risk and benefits of surgery include risk of bleeding infection anesthesia damage to normal structures and failure of the surgery to accomplish its  intended goals of relieving symptoms and restoring function with this in mind we'll going to proceed. I have specifically discussed with the patient the pre-and postoperative regime and the does and don'ts and risk and benefits in great detail. Risk and benefits of surgery also include risk of dystrophy chronic nerve pain failure of the healing process to go onto completion and other inherent risks of surgery The relavent the pathophysiology of the disease/injury process, as well as the alternatives for treatment and postoperative course of action has been discussed in great detail with the patient who desires to proceed.  We will do everything in our power to help you (the patient) restore function to the upper extremity. Is a pleasure to see this patient today.   Karen Chafe 10/21/2012, 9:50 AM

## 2012-10-21 NOTE — Op Note (Signed)
See dictation #[id number] Dominica Severin MD

## 2012-10-22 NOTE — Progress Notes (Signed)
No acute PT needs.  OT to address all therapy needs.  PT sign off.  Aida Raider, PT  Office # [phone] Pager [phone]

## 2012-10-22 NOTE — Discharge Summary (Signed)
  Patient was admitted overnight for IV antibiotics status post ORIF left elbow with ulnar nerve release. She did well she had no complicating features. At the time for discharge she was awake alert and oriented and had no complicating features. Marland Kitchen.The patient is alert and oriented in no acute distress the patient complains of pain in the affected upper extremity. The patient is noted to have a normal HEENT exam. Lung fields show equal chest expansion and no shortness of breath abdomen exam is nontender without distention. Lower extremity examination does not show any fracture dislocation or blood clot symptoms. Pelvis is stable neck and back are stable and nontender  Final Dx-SP ORIF Left elbow fx with CUTR  Diet regular RTC 10-14 days Wendall Stade MD

## 2012-10-22 NOTE — Progress Notes (Signed)
Subjective: 1 Day Post-Op Procedure(s) (LRB): OPEN REDUCTION INTERNAL FIXATION left elbow medial epicondyle and ulnar nerve release  (Left) Patient reports pain as mild.    Objective: Vital signs in last 24 hours: Temp:  [98 F (36.7 C)-98.6 F (37 C)] 98 F (36.7 C) (02/16 0405) Pulse Rate:  [54-72] 61 (02/16 0405) Resp:  [12-19] 18 (02/16 0405) BP: (93-119)/(45-71) 101/57 mmHg (02/16 0405) SpO2:  [98 %-100 %] 100 % (02/16 0405) Weight:  [65.772 kg (145 lb)] 65.772 kg (145 lb) (02/16 0500)  Intake/Output from previous day: 02/15 0701 - 02/16 0700 In: 1930 [P.O.:680; I.V.:1250] Out: 210 [Urine:200; Blood:10] Intake/Output this shift:     Recent Labs  10/20/12 0819  HGB 12.2    Recent Labs  10/20/12 0819  WBC 5.3  RBC 4.09  HCT 36.0  PLT 182    Recent Labs  10/20/12 0819  NA 136  K 4.1  CL 103  CO2 27  BUN 15  CREATININE 0.67  GLUCOSE 90  CALCIUM 9.7   No results found for this basename: LABPT, INR,  in the last 72 hours  Neurologically intact ABD soft Neurovascular intact Sensation intact distally Intact pulses distally Dorsiflexion/Plantar flexion intact Incision: dressing C/D/I No cellulitis present Compartment soft  Assessment/Plan: 1 Day Post-Op Procedure(s) (LRB): OPEN REDUCTION INTERNAL FIXATION left elbow medial epicondyle and ulnar nerve release  (Left) Patient is doing quite well she is awake alert and oriented. Her ulnar nerve looks excellent. We have discharge her today with followup in 10-14 days. She'll see myself and therapy in 10-14 days. I feel she can very well. She's had an uneventful hospital stay status post elbow reconstruction. I spent greater than 20 minutes space space time with her going over all issues and all questions have been encouraged and answered. Karen Chafe 10/22/2012, 11:33 AM

## 2012-10-22 NOTE — Progress Notes (Signed)
Occupational Therapy Evaluation Patient Details Name: Loretta Hodges MRN: [id number] DOB: 11-26-1960 Today's Date: 10/22/2012 Time: 1478-2956 OT Time Calculation (min): 30 min  OT Assessment / Plan / Recommendation Clinical Impression  52 yo s/p fall with elbow fracture.Underwent ORIF L medial olecranon and ulnar n transposition. Completed all education regarding compensatory techniques for ADL, edema control, precautions and ROM. Pt to follow up with Dr. Butler Denmark for further OT.     OT Assessment  All further OT needs can be met in the next venue of care    Follow Up Recommendations  Other (comment) (f/u with Dr. Butler Denmark)    Barriers to Discharge  none    Equipment Recommendations  None recommended by OT    Recommendations for Other Services    Frequency       Precautions / Restrictions Precautions Precautions: None Precaution Comments: postop splint Restrictions Weight Bearing Restrictions: Yes LUE Weight Bearing: Non weight bearing   Pertinent Vitals/Pain "burning". Pain meds given    ADL  ADL Comments: Completed education regarding compensatory techniques for ADL. Pt demonstrates understanding    OT Diagnosis: Generalized weakness;Acute pain  OT Problem List: Decreased strength;Decreased range of motion;Decreased coordination;Impaired UE functional use;Pain OT Treatment Interventions:     OT Goals Acute Rehab OT Goals OT Goal Formulation:  (eval only)  Visit Information  Last OT Received On: 10/22/12 Assistance Needed: +1    Subjective Data      Prior Functioning     Home Living Lives With: Spouse Available Help at Discharge: Family;Friend(s);Available PRN/intermittently Type of Home: House Home Access: Level entry Home Layout: Two level;Bed/bath upstairs Alternate Level Stairs-Number of Steps: 12 Bathroom Shower/Tub: Walk-in shower;Door [employer] Toilet: Standard Home Adaptive Equipment: Built-in shower seat Prior Function Level of  Independence: Independent Able to Take Stairs?: Yes Driving: Yes Communication Communication: Prefers language other than English Dominant Hand: Right         Vision/Perception Vision - History Baseline Vision: Wears glasses all the time   Cognition  Cognition Overall Cognitive Status: Appears within functional limits for tasks assessed/performed Arousal/Alertness: Awake/alert Orientation Level: Appears intact for tasks assessed Behavior During Session: [facility] for tasks performed    Extremity/Trunk Assessment Right Upper Extremity Assessment RUE ROM/Strength/Tone: Within functional levels Left Upper Extremity Assessment LUE ROM/Strength/Tone: Deficits;Due to precautions;Due to pain LUE ROM/Strength/Tone Deficits: WFL with the exception of wrist and elbow LUE Sensation: WFL - Light Touch;WFL - Proprioception LUE Coordination: Deficits LUE Coordination Deficits: due to splint Right Lower Extremity Assessment RLE ROM/Strength/Tone: Within functional levels Left Lower Extremity Assessment LLE ROM/Strength/Tone: Within functional levels Trunk Assessment Trunk Assessment: Normal     Mobility Bed Mobility Bed Mobility: Rolling Right;Right Sidelying to Sit Rolling Right: 7: Independent Right Sidelying to Sit: 7: Independent Transfers Transfers: Sit to Stand;Stand to Sit Sit to Stand: 7: Independent Stand to Sit: 7: Independent     Exercise Other Exercises Other Exercises: hand ROM Other Exercises: shoulder ROM Other Exercises: head and neck ROM Other Exercises: edema control - ice and elevation   Balance Balance Balance Assessed:  (WFL)   End of Session OT - End of Session Activity Tolerance: Patient tolerated treatment well Patient left: in chair;with call bell/phone within reach;with family/visitor present Nurse Communication: Mobility status;Other (comment) (ready for D/C)  GO Functional Assessment Tool Used: clinical judgement Functional Limitation: Self  care Self Care Current Status (O1308): At least 20 percent but less than 40 percent impaired, limited or restricted Self Care Goal Status (M5784): At least 1 percent but  less than 20 percent impaired, limited or restricted Self Care Discharge Status [phone]): At least 1 percent but less than 20 percent impaired, limited or restricted   [facility] 10/22/2012, 11:47 AM [facility], OTR/L  [phone] 10/22/2012

## 2012-10-23 NOTE — Progress Notes (Signed)
Assessed for utilization review 

## 2012-10-25 ENCOUNTER — Encounter (HOSPITAL_COMMUNITY): Payer: Self-pay | Admitting: Orthopedic Surgery

## 2013-09-14 ENCOUNTER — Encounter (HOSPITAL_BASED_OUTPATIENT_CLINIC_OR_DEPARTMENT_OTHER): Payer: Self-pay | Admitting: Emergency Medicine

## 2013-09-14 ENCOUNTER — Emergency Department (HOSPITAL_BASED_OUTPATIENT_CLINIC_OR_DEPARTMENT_OTHER)
Admission: EM | Admit: 2013-09-14 | Discharge: 2013-09-14 | Disposition: A | Discharge status: Home / Self Care | Payer: BC Managed Care – PPO | Attending: Emergency Medicine | Admitting: Emergency Medicine

## 2013-09-14 DIAGNOSIS — N201 Calculus of ureter: Secondary | ICD-10-CM | POA: Insufficient documentation

## 2013-09-14 DIAGNOSIS — Z79899 Other long term (current) drug therapy: Secondary | ICD-10-CM | POA: Insufficient documentation

## 2013-09-14 DIAGNOSIS — N23 Unspecified renal colic: Secondary | ICD-10-CM

## 2013-09-14 DIAGNOSIS — Z8719 Personal history of other diseases of the digestive system: Secondary | ICD-10-CM | POA: Insufficient documentation

## 2013-09-14 DIAGNOSIS — E785 Hyperlipidemia, unspecified: Secondary | ICD-10-CM | POA: Insufficient documentation

## 2013-09-14 LAB — URINALYSIS, ROUTINE W REFLEX MICROSCOPIC
Bilirubin Urine: NEGATIVE
Glucose, UA: NEGATIVE mg/dL
Ketones, ur: NEGATIVE mg/dL
NITRITE: NEGATIVE
PH: 5.5 (ref 5.0–8.0)
Protein, ur: NEGATIVE mg/dL
SPECIFIC GRAVITY, URINE: 1.023 (ref 1.005–1.030)
Urobilinogen, UA: 0.2 mg/dL (ref 0.0–1.0)

## 2013-09-14 LAB — URINE MICROSCOPIC-ADD ON

## 2013-09-14 MED ORDER — HYDROMORPHONE HCL PF 2 MG/ML IJ SOLN
2.0000 mg | Freq: Once | INTRAMUSCULAR | Status: AC
  Administered 2013-09-14: 2 mg via INTRAMUSCULAR
  Filled 2013-09-14: qty 1

## 2013-09-14 MED ORDER — OXYCODONE-ACETAMINOPHEN 5-325 MG PO TABS
1.0000 | ORAL_TABLET | Freq: Four times a day (QID) | ORAL | Status: DC | PRN
Start: 2013-09-14 — End: 2015-02-14

## 2013-09-14 MED ORDER — ONDANSETRON HCL 4 MG/2ML IJ SOLN
4.0000 mg | Freq: Once | INTRAMUSCULAR | Status: AC
  Administered 2013-09-14: 4 mg via INTRAMUSCULAR
  Filled 2013-09-14: qty 2

## 2013-09-14 MED ORDER — ONDANSETRON 4 MG PO TBDP
4.0000 mg | ORAL_TABLET | Freq: Once | ORAL | Status: AC
  Administered 2013-09-14: 4 mg via ORAL

## 2013-09-14 MED ORDER — TAMSULOSIN HCL 0.4 MG PO CAPS: 0.4000 mg | ORAL_CAPSULE | Freq: Every day | ORAL | Status: DC

## 2013-09-14 MED ORDER — ONDANSETRON HCL 4 MG PO TABS: 4.0000 mg | ORAL_TABLET | Freq: Three times a day (TID) | ORAL | Status: DC | PRN

## 2013-09-14 MED ORDER — ONDANSETRON 4 MG PO TBDP
ORAL_TABLET | ORAL | Status: AC
  Filled 2013-09-14: qty 1

## 2013-09-14 NOTE — ED Notes (Signed)
Pt states that she has had a kidney stone in the past (last year) pt states that her left flank is hurting and feels similar to the kidney stone she had last year. Pt denies problems with urination bowel. Pt denies blood in urine.

## 2013-09-14 NOTE — ED Provider Notes (Signed)
CSN: [id number]     Arrival date & time 09/14/13  1909 History  This chart was scribed for Winferd Wease B. Bernette Mayers, MD by Danella Maiers, ED Scribe. This patient was seen in room MH02/MH02 and the patient's care was started at 7:15 PM.   Chief Complaint  Patient presents with  . Flank Pain   The history is provided by the patient. No language interpreter was used.   HPI Comments: Loretta Hodges is a 53 y.o. female with a h/o kidney stones who presents to the Emergency Department complaining of sudden-onset left flank pain that radiates to the front onset 5pm today. She states walking around makes the pain better. She reports nausea but no vomiting. Pt reports the pain feels similar to the kidney stone she had last year. She states she passed the stone in the ED after about 6 hours last year.  Past Medical History  Diagnosis Date  . GERD (gastroesophageal reflux disease)   . Hyperlipidemia   . Medical history non-contributory    Past Surgical History  Procedure Laterality Date  . No past surgeries    . Orif ulnar fracture Left 10/21/2012    Procedure: OPEN REDUCTION INTERNAL FIXATION left elbow medial epicondyle and ulnar nerve release ;  Surgeon: Dominica Severin, MD;  Location: [facility] OR;  Service: Orthopedics;  Laterality: Left;  OPEN REDUCTION INTERNAL FIXATION left elbow medial epicondyle and ulnar nerve release    History reviewed. No pertinent family history. History  Substance Use Topics  . Smoking status: Never Smoker   . Smokeless tobacco: Not on file  . Alcohol Use: Yes     Comment: occ   OB History   Grav Para Term Preterm Abortions TAB SAB Ect Mult Living                 Review of Systems  Gastrointestinal: Positive for nausea. Negative for vomiting.  Genitourinary: Positive for flank pain.   A complete 10 system review of systems was obtained and all systems are negative except as noted in the HPI and PMH.   Allergies  Review of patient's allergies indicates no known  allergies.  Home Medications   Current Outpatient Rx  Name  Route  Sig  Dispense  Refill  . simvastatin (ZOCOR) 20 MG tablet   Oral   Take 20 mg by mouth every evening.          BP 123/69  Pulse 60  Temp(Src) 98.5 F (36.9 C) (Oral)  Resp 18  Ht 5\' 4"  (1.626 m)  Wt 140 lb (63.504 kg)  BMI 24.02 kg/m2  SpO2 100% Physical Exam  Nursing note and vitals reviewed. Constitutional: She is oriented to person, place, and time. She appears well-developed and well-nourished.  Uncomfortable appearing  HENT:  Head: Normocephalic and atraumatic.  Eyes: EOM are normal. Pupils are equal, round, and reactive to light.  Neck: Normal range of motion. Neck supple.  Cardiovascular: Normal rate, normal heart sounds and intact distal pulses.   Pulmonary/Chest: Effort normal and breath sounds normal.  Abdominal: Bowel sounds are normal. She exhibits no distension. There is no tenderness.  Musculoskeletal: Normal range of motion. She exhibits no edema and no tenderness.  Neurological: She is alert and oriented to person, place, and time. She has normal strength. No cranial nerve deficit or sensory deficit.  Skin: Skin is warm and dry. No rash noted.  Psychiatric: She has a normal mood and affect.    ED Course  Procedures (including critical care  time) Medications  HYDROmorphone (DILAUDID) injection 2 mg (2 mg Intramuscular Given 09/14/13 1936)  ondansetron (ZOFRAN) injection 4 mg (4 mg Intramuscular Given 09/14/13 1932)    DIAGNOSTIC STUDIES: Oxygen Saturation is 100% on RA, normal by my interpretation.    COORDINATION OF CARE: 7:27 PM- Discussed treatment plan with pt which includes Zofran and Dilaudid injections. Pt agrees to plan.   Labs Review Labs Reviewed  URINALYSIS, ROUTINE W REFLEX MICROSCOPIC - Abnormal; Notable for the following:    Hgb urine dipstick SMALL (*)    Leukocytes, UA SMALL (*)    All other components within normal limits  URINE MICROSCOPIC-ADD ON   Imaging  Review No results found.  EKG Interpretation   None       MDM   1. Ureteral colic     Pt with known renal stones from CT about a year ago having symptoms consistent with same. Normal UA, pain improved with IM meds. Some continued nausea, but she is ready to go home. Given strainer. Pain and nausea meds, Flomax and Urology referral.   I personally performed the services described in this documentation, which was scribed in my presence. The recorded information has been reviewed and is accurate.      Abygail Galeno B. Bernette MayersSheldon, MD 09/14/13 2049

## 2013-09-14 NOTE — ED Notes (Signed)
MD at bedside. 

## 2013-09-14 NOTE — Discharge Instructions (Signed)
Kidney Stones  Kidney stones (urolithiasis) are deposits that form inside your kidneys. The intense pain is caused by the stone moving through the urinary tract. When the stone moves, the ureter goes into spasm around the stone. The stone is usually passed in the urine.   CAUSES   · A disorder that makes certain neck glands produce too much parathyroid hormone (primary hyperparathyroidism).  · A buildup of uric acid crystals, similar to gout in your joints.  · Narrowing (stricture) of the ureter.  · A kidney obstruction present at birth (congenital obstruction).  · Previous surgery on the kidney or ureters.  · Numerous kidney infections.  SYMPTOMS   · Feeling sick to your stomach (nauseous).  · Throwing up (vomiting).  · Blood in the urine (hematuria).  · Pain that usually spreads (radiates) to the groin.  · Frequency or urgency of urination.  DIAGNOSIS   · Taking a history and physical exam.  · Blood or urine tests.  · CT scan.  · Occasionally, an examination of the inside of the urinary bladder (cystoscopy) is performed.  TREATMENT   · Observation.  · Increasing your fluid intake.  · Extracorporeal shock wave lithotripsy This is a noninvasive procedure that uses shock waves to break up kidney stones.  · Surgery may be needed if you have severe pain or persistent obstruction. There are various surgical procedures. Most of the procedures are performed with the use of small instruments. Only small incisions are needed to accommodate these instruments, so recovery time is minimized.  The size, location, and chemical composition are all important variables that will determine the proper choice of action for you. Talk to your health care provider to better understand your situation so that you will minimize the risk of injury to yourself and your kidney.   HOME CARE INSTRUCTIONS   · Drink enough water and fluids to keep your urine clear or pale yellow. This will help you to pass the stone or stone fragments.  · Strain  all urine through the provided strainer. Keep all particulate matter and stones for your health care provider to see. The stone causing the pain may be as small as a grain of salt. It is very important to use the strainer each and every time you pass your urine. The collection of your stone will allow your health care provider to analyze it and verify that a stone has actually passed. The stone analysis will often identify what you can do to reduce the incidence of recurrences.  · Only take over-the-counter or prescription medicines for pain, discomfort, or fever as directed by your health care provider.  · Make a follow-up appointment with your health care provider as directed.  · Get follow-up X-rays if required. The absence of pain does not always mean that the stone has passed. It may have only stopped moving. If the urine remains completely obstructed, it can cause loss of kidney function or even complete destruction of the kidney. It is your responsibility to make sure X-rays and follow-ups are completed. Ultrasounds of the kidney can show blockages and the status of the kidney. Ultrasounds are not associated with any radiation and can be performed easily in a matter of minutes.  SEEK MEDICAL CARE IF:  · You experience pain that is progressive and unresponsive to any pain medicine you have been prescribed.  SEEK IMMEDIATE MEDICAL CARE IF:   · Pain cannot be controlled with the prescribed medicine.  · You have a fever   or shaking chills.  · The severity or intensity of pain increases over 18 hours and is not relieved by pain medicine.  · You develop a new onset of abdominal pain.  · You feel faint or pass out.  · You are unable to urinate.  MAKE SURE YOU:   · Understand these instructions.  · Will watch your condition.  · Will get help right away if you are not doing well or get worse.  Document Released: 08/23/2005 Document Revised: 04/25/2013 Document Reviewed: 01/24/2013  [employer]® Patient Information ©2014  [employer].

## 2015-02-14 ENCOUNTER — Emergency Department (HOSPITAL_BASED_OUTPATIENT_CLINIC_OR_DEPARTMENT_OTHER)
Admission: EM | Admit: 2015-02-14 | Discharge: 2015-02-14 | Disposition: A | Discharge status: Home / Self Care | Payer: BLUE CROSS/BLUE SHIELD | Attending: Emergency Medicine | Admitting: Emergency Medicine

## 2015-02-14 ENCOUNTER — Encounter (HOSPITAL_BASED_OUTPATIENT_CLINIC_OR_DEPARTMENT_OTHER): Payer: Self-pay | Admitting: *Deleted

## 2015-02-14 ENCOUNTER — Emergency Department (HOSPITAL_BASED_OUTPATIENT_CLINIC_OR_DEPARTMENT_OTHER): Payer: BLUE CROSS/BLUE SHIELD

## 2015-02-14 DIAGNOSIS — M546 Pain in thoracic spine: Secondary | ICD-10-CM

## 2015-02-14 DIAGNOSIS — E785 Hyperlipidemia, unspecified: Secondary | ICD-10-CM | POA: Diagnosis not present

## 2015-02-14 DIAGNOSIS — M549 Dorsalgia, unspecified: Secondary | ICD-10-CM | POA: Diagnosis present

## 2015-02-14 DIAGNOSIS — R112 Nausea with vomiting, unspecified: Secondary | ICD-10-CM | POA: Insufficient documentation

## 2015-02-14 DIAGNOSIS — N390 Urinary tract infection, site not specified: Secondary | ICD-10-CM | POA: Insufficient documentation

## 2015-02-14 DIAGNOSIS — R1031 Right lower quadrant pain: Secondary | ICD-10-CM | POA: Insufficient documentation

## 2015-02-14 DIAGNOSIS — Z79899 Other long term (current) drug therapy: Secondary | ICD-10-CM | POA: Diagnosis not present

## 2015-02-14 DIAGNOSIS — Z8719 Personal history of other diseases of the digestive system: Secondary | ICD-10-CM | POA: Diagnosis not present

## 2015-02-14 LAB — BASIC METABOLIC PANEL
ANION GAP: 5 (ref 5–15)
BUN: 24 mg/dL — ABNORMAL HIGH (ref 6–20)
CHLORIDE: 107 mmol/L (ref 101–111)
CO2: 27 mmol/L (ref 22–32)
Calcium: 9.1 mg/dL (ref 8.9–10.3)
Creatinine, Ser: 0.84 mg/dL (ref 0.44–1.00)
GFR calc Af Amer: >60 mL/min (ref >60)
GFR calc non Af Amer: >60 mL/min (ref >60)
Glucose, Bld: 115 mg/dL — ABNORMAL HIGH (ref 65–99)
Potassium: 3.8 mmol/L (ref 3.5–5.1)
SODIUM: 139 mmol/L (ref 135–145)

## 2015-02-14 LAB — URINE MICROSCOPIC-ADD ON

## 2015-02-14 LAB — CBC WITH DIFFERENTIAL/PLATELET
BASOS ABS: 0 10*3/uL (ref 0.0–0.1)
Basophils Relative: 0 % (ref 0–1)
EOS ABS: 0 10*3/uL (ref 0.0–0.7)
EOS PCT: 1 % (ref 0–5)
HCT: 37.7 % (ref 36.0–46.0)
Hemoglobin: 12.6 g/dL (ref 12.0–15.0)
LYMPHS ABS: 1.1 10*3/uL (ref 0.7–4.0)
Lymphocytes Relative: 14 % (ref 12–46)
MCH: 29.6 pg (ref 26.0–34.0)
MCHC: 33.4 g/dL (ref 30.0–36.0)
MCV: 88.7 fL (ref 78.0–100.0)
MONOS PCT: 6 % (ref 3–12)
Monocytes Absolute: 0.4 10*3/uL (ref 0.1–1.0)
NEUTROS PCT: 79 % — AB (ref 43–77)
Neutro Abs: 6.1 10*3/uL (ref 1.7–7.7)
Platelets: 221 10*3/uL (ref 150–400)
RBC: 4.25 MIL/uL (ref 3.87–5.11)
RDW: 12.8 % (ref 11.5–15.5)
WBC: 7.6 10*3/uL (ref 4.0–10.5)

## 2015-02-14 LAB — URINALYSIS, ROUTINE W REFLEX MICROSCOPIC
Bilirubin Urine: NEGATIVE
GLUCOSE, UA: NEGATIVE mg/dL
HGB URINE DIPSTICK: NEGATIVE
Ketones, ur: NEGATIVE mg/dL
Nitrite: NEGATIVE
PH: 7 (ref 5.0–8.0)
Protein, ur: NEGATIVE mg/dL
SPECIFIC GRAVITY, URINE: 1.025 (ref 1.005–1.030)
Urobilinogen, UA: 0.2 mg/dL (ref 0.0–1.0)

## 2015-02-14 MED ORDER — ONDANSETRON HCL 4 MG/2ML IJ SOLN
4.0000 mg | Freq: Once | INTRAMUSCULAR | Status: AC
  Administered 2015-02-14: 4 mg via INTRAVENOUS
  Filled 2015-02-14: qty 2

## 2015-02-14 MED ORDER — MORPHINE SULFATE 2 MG/ML IJ SOLN
INTRAMUSCULAR | Status: AC
  Administered 2015-02-14: 2 mg via INTRAVENOUS
  Filled 2015-02-14: qty 1

## 2015-02-14 MED ORDER — MORPHINE SULFATE 4 MG/ML IJ SOLN
6.0000 mg | Freq: Once | INTRAMUSCULAR | Status: AC
  Administered 2015-02-14: 4 mg via INTRAVENOUS
  Filled 2015-02-14: qty 2

## 2015-02-14 MED ORDER — SODIUM CHLORIDE 0.9 % IV BOLUS (SEPSIS)
1000.0000 mL | Freq: Once | INTRAVENOUS | Status: AC
  Administered 2015-02-14: 1000 mL via INTRAVENOUS

## 2015-02-14 MED ORDER — CEPHALEXIN 500 MG PO CAPS: 500.0000 mg | ORAL_CAPSULE | Freq: Four times a day (QID) | ORAL | Status: DC

## 2015-02-14 NOTE — ED Provider Notes (Signed)
CSN: [id number]     Arrival date & time 02/14/15  1507 History   First MD Initiated Contact with Patient 02/14/15 1613     Chief Complaint  Patient presents with  . Back Pain     (Consider location/radiation/quality/duration/timing/severity/associated sxs/prior Treatment) HPI  54 year old female with right-sided back pain that has been worsening over the past 1 month. A couple weeks ago she had a negative CT scan by her PCP. There were kidney stones but no obstructing stones at that time. Over the last few days she is now having pain radiating into her right abdomen. This feels similar to prior kidney stones. She's been taking ibuprofen and now recently is taking hydrocodone that she had left over. 1 hydrocodone a couple hours prior to arrival. Is currently having nausea and vomiting. Denies any fevers, dysuria, or hematuria. No midline pain, weakness, numbness or incontinence.  Past Medical History  Diagnosis Date  . GERD (gastroesophageal reflux disease)   . Hyperlipidemia   . Medical history non-contributory    Past Surgical History  Procedure Laterality Date  . No past surgeries    . Orif ulnar fracture Left 10/21/2012    Procedure: OPEN REDUCTION INTERNAL FIXATION left elbow medial epicondyle and ulnar nerve release ;  Surgeon: Dominica Severin, MD;  Location: [facility] OR;  Service: Orthopedics;  Laterality: Left;  OPEN REDUCTION INTERNAL FIXATION left elbow medial epicondyle and ulnar nerve release    History reviewed. No pertinent family history. History  Substance Use Topics  . Smoking status: Never Smoker   . Smokeless tobacco: Not on file  . Alcohol Use: Yes     Comment: occ   OB History    No data available     Review of Systems  Constitutional: Negative for fever.  Gastrointestinal: Positive for nausea, vomiting and abdominal pain.  Genitourinary: Positive for flank pain. Negative for dysuria and hematuria.  Musculoskeletal: Positive for back pain.  Neurological: Negative  for weakness and numbness.  All other systems reviewed and are negative.     Allergies  Review of patient's allergies indicates no known allergies.  Home Medications   Prior to Admission medications   Medication Sig Start Date End Date Taking? Authorizing Provider  HYDROcodone-acetaminophen (NORCO/VICODIN) 5-325 MG per tablet Take 1 tablet by mouth every 6 (six) hours as needed for moderate pain.   Yes Historical Provider, MD  simvastatin (ZOCOR) 20 MG tablet Take 20 mg by mouth every evening.    Historical Provider, MD  tamsulosin (FLOMAX) 0.4 MG CAPS capsule Take 1 capsule (0.4 mg total) by mouth daily. 09/14/13   Susy Frizzle, MD   BP 140/70 mmHg  Pulse 73  Temp(Src) 98.3 F (36.8 C)  Ht  (1.626 m)  Wt 150 lb (68.04 kg)  BMI 25.73 kg/m2  SpO2 99% Physical Exam  Constitutional: She is oriented to person, place, and time. She appears well-developed and well-nourished.  Pacing around room holding right back  HENT:  Head: Normocephalic and atraumatic.  Right Ear: External ear normal.  Left Ear: External ear normal.  Nose: Nose normal.  Eyes: Right eye exhibits no discharge. Left eye exhibits no discharge.  Cardiovascular: Normal rate, regular rhythm and normal heart sounds.   Pulmonary/Chest: Effort normal and breath sounds normal.  Abdominal: Soft. There is tenderness in the right lower quadrant. There is CVA tenderness (right sided).  Musculoskeletal:       Lumbar back: She exhibits no bony tenderness.  Neurological: She is alert and oriented to person,  place, and time.  Reflex Scores:      Patellar reflexes are 2+ on the right side and 2+ on the left side.      Achilles reflexes are 2+ on the right side and 2+ on the left side. Normal strength and sensation in lower extremities. Normal gait.  Skin: Skin is warm and dry.  Nursing note and vitals reviewed.   ED Course  Procedures (including critical care time) Labs Review Labs Reviewed  BASIC METABOLIC PANEL  - Abnormal; Notable for the following:    Glucose, Bld 115 (*)    BUN 24 (*)    All other components within normal limits  CBC WITH DIFFERENTIAL/PLATELET - Abnormal; Notable for the following:    Neutrophils Relative % 79 (*)    All other components within normal limits  URINALYSIS, ROUTINE W REFLEX MICROSCOPIC (NOT AT [facility]) - Abnormal; Notable for the following:    APPearance CLOUDY (*)    Leukocytes, UA MODERATE (*)    All other components within normal limits  URINE MICROSCOPIC-ADD ON - Abnormal; Notable for the following:    Squamous Epithelial / LPF FEW (*)    All other components within normal limits  URINE CULTURE    Imaging Review Ct Renal Stone Study  02/14/2015   CLINICAL DATA:  Right-sided flank pain for 1 month, worsening over the last few days.  EXAM: CT ABDOMEN AND PELVIS WITHOUT CONTRAST  TECHNIQUE: Multidetector CT imaging of the abdomen and pelvis was performed following the standard protocol without IV contrast.  COMPARISON:  Plain films 01/23/2014.  CT of 01/24/2015.  FINDINGS: Lower chest: A left lower lobe 4 mm nodule on image 4 was present on 10/05/2011 and is therefore benign. Normal heart size without pericardial or pleural effusion.  Hepatobiliary: Normal liver. Normal gallbladder, without biliary ductal dilatation.  Pancreas: Normal, without mass or ductal dilatation.  Spleen: Normal  Adrenals/Urinary Tract: Normal adrenal glands. Multiple bilateral renal collecting system calculi. Greater in number on the left. Suspect left renal sinus cysts, without hydroureter. No ureteric stones. No bladder calculi.  Stomach/Bowel: Normal stomach, without wall thickening. Colonic stool burden suggests constipation. Normal terminal ileum and appendix. Normal small bowel.  Vascular/Lymphatic: Normal caliber of the aorta and branch vessels. No abdominopelvic adenopathy.  Reproductive: Normal uterus and adnexa.  Other: No significant free fluid.  Musculoskeletal: No acute osseous  abnormality.  IMPRESSION: 1. Bilateral nephrolithiasis, without obstructive uropathy. 2.  Possible constipation. 3. Normal appendix.   Electronically Signed   By: Jeronimo Greaves M.D.   On: 02/14/2015 16:53     EKG Interpretation None      MDM   Final diagnoses:  Right-sided thoracic back pain  UTI (lower urinary tract infection)    Patient's history is most concerning for a kidney stone but CT shows no evidence of obstruction. Feels better with treatment in ED. Exam otherwise unremarkable. Does not have dysuria but given flank pain will treat as if UTI/pyelo and recommend close f/u with PCP. Could be MSK but length of symptoms is odd. No alarming findings at this time, and she has adequate pain control at home per her. Discussed strict return precautions.    Pricilla Loveless, MD 02/15/15 [phone]

## 2015-02-14 NOTE — ED Notes (Signed)
Pt c/o right lower back pain x 1 month eval by PMD for kidney stone CT neg

## 2015-02-18 LAB — URINE CULTURE

## 2015-02-19 ENCOUNTER — Telehealth (HOSPITAL_BASED_OUTPATIENT_CLINIC_OR_DEPARTMENT_OTHER): Payer: Self-pay | Admitting: Emergency Medicine

## 2015-02-19 NOTE — Progress Notes (Signed)
ED Antimicrobial Stewardship Positive Culture Follow Up   [facility] Loretta Hodges is an 54 y.o. female who presented to [facility] on 02/14/2015 with a chief complaint of  Chief Complaint  Patient presents with  . Back Pain    Recent Results (from the past 720 hour(s))  Urine culture     Status: None   Collection Time: 02/14/15  6:25 PM  Result Value Ref Range Status   Specimen Description URINE, CATHETERIZED  Final   Special Requests NONE  Final   Colony Count   Final    45,000 COLONIES/ML Performed at [facility]    Culture   Final    ENTEROCOCCUS SPECIES LACTOBACILLUS SPECIES Note: Standardized susceptibility testing for this organism is not available. Performed at [facility]    Report Status 02/18/2015 FINAL  Final   Organism ID, Bacteria ENTEROCOCCUS SPECIES  Final      Susceptibility   Enterococcus species - MIC*    AMPICILLIN <=2 SENSITIVE Sensitive     LEVOFLOXACIN 1 SENSITIVE Sensitive     NITROFURANTOIN <=16 SENSITIVE Sensitive     VANCOMYCIN 1 SENSITIVE Sensitive     TETRACYCLINE <=1 SENSITIVE Sensitive     * ENTEROCOCCUS SPECIES    [x]  Treated with cephalexin, organism resistant to prescribed antimicrobial []  Patient discharged originally without antimicrobial agent and treatment is now indicated  New antibiotic prescription: Follow up with urologist for kidney stones.  Stop cephalexin.  New prescription - ciprofloxacin 500mg  po BID x 7 days  ED Provider: Langston Masker, PA-C   Mickeal Skinner 02/19/2015, 10:20 AM Infectious Diseases Pharmacist Phone# [phone]

## 2015-02-19 NOTE — Telephone Encounter (Signed)
Post ED Visit - Positive Culture Follow-up: Successful Patient Follow-Up  Culture assessed and recommendations reviewed by: [x]  Celedonio Miyamoto, Pharm.D., BCPS-AQ ID []  Georgina Pillion, [address].D., BCPS []  [place], [place].D., BCPS, AAHIVP []  Estella Husk, Pharm.D., BCPS, AAHIVP []  Tegan Magsam, Pharm.D. []  Tennis Must, Pharm.D.  Positive urine culture Enterococcus  []  Patient discharged without antimicrobial prescription and treatment is now indicated [x]  Organism is resistant to prescribed ED discharge antimicrobial []  Patient with positive blood cultures  Changes discussed with ED provider: Langston Masker PA New antibiotic prescription stop cephalexin, start Cipro 500mg  po bid x 7 days Called to [employer] [facility] [phone]  Contacted patient, 02/19/15 1338  Berle Mull 02/19/2015, 1:35 PM

## 2015-03-11 DIAGNOSIS — N200 Calculus of kidney: Secondary | ICD-10-CM | POA: Insufficient documentation

## 2016-08-04 ENCOUNTER — Ambulatory Visit (INDEPENDENT_AMBULATORY_CARE_PROVIDER_SITE_OTHER): Payer: BLUE CROSS/BLUE SHIELD | Admitting: Obstetrics and Gynecology

## 2016-08-04 ENCOUNTER — Encounter: Payer: Self-pay | Admitting: Obstetrics and Gynecology

## 2016-08-04 VITALS — BP 100/64 | HR 64 | Resp 14 | Ht 64.0 in | Wt 151.4 lb

## 2016-08-04 DIAGNOSIS — N3281 Overactive bladder: Secondary | ICD-10-CM | POA: Diagnosis not present

## 2016-08-04 DIAGNOSIS — Z01419 Encounter for gynecological examination (general) (routine) without abnormal findings: Secondary | ICD-10-CM

## 2016-08-04 DIAGNOSIS — N6320 Unspecified lump in the left breast, unspecified quadrant: Secondary | ICD-10-CM

## 2016-08-04 DIAGNOSIS — Z0000 Encounter for general adult medical examination without abnormal findings: Secondary | ICD-10-CM | POA: Diagnosis not present

## 2016-08-04 LAB — POCT URINALYSIS DIPSTICK
Bilirubin, UA: NEGATIVE
Blood, UA: NEGATIVE
Glucose, UA: NEGATIVE
KETONES UA: NEGATIVE
Leukocytes, UA: NEGATIVE
NITRITE UA: NEGATIVE
PH UA: 6
PROTEIN UA: NEGATIVE
Urobilinogen, UA: NEGATIVE

## 2016-08-04 MED ORDER — MIRABEGRON ER 25 MG PO TB24: 25.0000 mg | ORAL_TABLET | Freq: Every day | ORAL | 1 refills | Status: DC

## 2016-08-04 NOTE — Patient Instructions (Signed)

## 2016-08-04 NOTE — Progress Notes (Signed)
Spoke with Hospital doctorAmber at [facility]. Patient scheduled for left breast diagnostic MMG and left breast ultrasound while in office. Was advised patient will still need screening MMG in Jan 2018.  Patient scheduled at [facility] 08/09/16 arriving at 11:20am, appt at 11:40am. Patient scheduled for 6 week f/u with Dr. Edward JollySilva 09/15/16 at 8:15am. Patient verbalizes understanding and is agreeable to date and time.

## 2016-08-04 NOTE — Progress Notes (Signed)
55 y.o. [id number] Married Caucasian [place] female here for annual exam.    Thinks her bladder has dropped.  Has urgency and occasional urinary incontinence.  DF - every 15 minutes. NF - once. Denies dysuria. One caffeine per day. Drinks lemonade all day long and has water with lemon. Hx renal stones.   Normal bowel function.  Has early glaucoma.  PCP: Bernadette Hoitafaela Aguiar, MD    Patient's last menstrual period was 10/01/2006 (exact date).           Sexually active: Yes.   female The current method of family planning is post menopausal status.    Exercising: Yes.    Weights and cardio Smoker:  no  Health Maintenance: Pap:  2015 normal per patient History of abnormal Pap:  no MMG:  10-06-15 normal per patient -- [facility] Colonoscopy:  09-07-2011 polyps(unsure of MDs name)--due 2018 BMD:   n/a  Result  n/a TDaP:  2011 Gardasil:   N/A Hep C:  Negative with PCP July 2017. Screening Labs:  Hb today: PCP, Urine today: Neg   reports that she has never smoked. She has never used smokeless tobacco. She reports that she drinks about 1.2 oz of alcohol per week . She reports that she does not use drugs.  Past Medical History:  Diagnosis Date  . GERD (gastroesophageal reflux disease)   . H/O cervical polypectomy   . History of kidney stones   . Hyperlipidemia   . Medical history non-contributory     Past Surgical History:  Procedure Laterality Date  . NO PAST SURGERIES    . ORIF ULNAR FRACTURE Left 10/21/2012   Procedure: OPEN REDUCTION INTERNAL FIXATION left elbow medial epicondyle and ulnar nerve release ;  Surgeon: Dominica SeverinWilliam Gramig, MD;  Location: [facility] OR;  Service: Orthopedics;  Laterality: Left;  OPEN REDUCTION INTERNAL FIXATION left elbow medial epicondyle and ulnar nerve release     Current Outpatient Prescriptions  Medication Sig Dispense Refill  . simvastatin (ZOCOR) 10 MG tablet Take 1 tablet by mouth daily.     No current facility-administered medications for this  visit.     Family History  Problem Relation Age of Onset  . Hypertension Father   . Stroke Father   . Ovarian cancer Maternal Grandmother [id number]    dec ovarian ca  . Diabetes Paternal Grandfather     ROS:  Pertinent items are noted in HPI.  Otherwise, a comprehensive ROS was negative.  Exam:   BP 100/64 (BP Location: Right Arm, Patient Position: Sitting, Cuff Size: Normal)   Pulse 64   Resp 14   Ht 5\' 4"  (1.626 m)   Wt 151 lb 6.4 oz (68.7 kg)   LMP 10/01/2006 (Exact Date)   BMI 25.99 kg/m     General appearance: alert, cooperative and appears stated age Head: Normocephalic, without obvious abnormality, atraumatic Neck: no adenopathy, supple, symmetrical, trachea midline and thyroid normal to inspection and palpation Lungs: clear to auscultation bilaterally Breasts: normal appearance, no masses or tenderness, No nipple retraction or dimpling, No nipple discharge or bleeding, No axillary or supraclavicular adenopathy on right.  Left breast with 2.5 cm mass at 3:00. No nodes, retractions, or nipple discharge. Heart: regular rate and rhythm Abdomen: soft, non-tender; no masses, no organomegaly Extremities: extremities normal, atraumatic, no cyanosis or edema Skin: Skin color, texture, turgor normal. No rashes or lesions Lymph nodes: Cervical, supraclavicular, and axillary nodes normal. No abnormal inguinal nodes palpated Neurologic: Grossly normal  Pelvic: External genitalia:  no lesions  Urethra:  normal appearing urethra with no masses, tenderness or lesions              Bartholins and Skenes: normal                 Vagina: normal appearing vagina with normal color and discharge, no lesions.  No significant prolapse.              Cervix: no lesions              Pap taken: Yes.   Bimanual Exam:  Uterus:  normal size, contour, position, consistency, mobility, non-tender              Adnexa: no mass, fullness, tenderness              Rectal exam: Yes.  .  Confirms.               Anus:  normal sphincter tone, no lesions  Chaperone was present for exam.  Assessment:   Well woman visit with normal exam. FH ovarian cancer in MGM. Overactive bladder.  Left breast mass?  Plan: Dx mammogram and left breast ultrasound.  Recommended self breast exam.  Pap and HR HPV as above. Discussed Calcium, Vitamin D, regular exercise program including cardiovascular and weight bearing exercise. Start Myrbetriq 25 mg daily.  Discussed side effects.  Will hold off on PT at this time. Discussed bladder irritants. Follow up in 6 weeks. Follow up annually and prn.   Additional counseling given.  Yes.  . _10______ minutes face to face time of which over 50% was spent in counseling regarding left breast mass and overactive bladder.    After visit summary provided.

## 2016-08-09 ENCOUNTER — Ambulatory Visit
Admission: RE | Admit: 2016-08-09 | Discharge: 2016-08-09 | Disposition: A | Discharge status: Home / Self Care | Payer: BLUE CROSS/BLUE SHIELD | Source: Ambulatory Visit | Attending: Obstetrics and Gynecology | Admitting: Obstetrics and Gynecology

## 2016-08-09 DIAGNOSIS — N6320 Unspecified lump in the left breast, unspecified quadrant: Secondary | ICD-10-CM

## 2016-08-09 LAB — IPS PAP TEST WITH HPV

## 2016-09-01 ENCOUNTER — Other Ambulatory Visit: Payer: Self-pay | Admitting: Obstetrics and Gynecology

## 2016-09-01 DIAGNOSIS — Z1231 Encounter for screening mammogram for malignant neoplasm of breast: Secondary | ICD-10-CM

## 2016-09-13 NOTE — Progress Notes (Signed)
GYNECOLOGY  VISIT   HPI: 56 y.o.   Married  Caucasian/[place]ian female   G3P3000 with Patient's last menstrual period was 10/01/2006 (exact date).   here for follow up on left breast and recheck on overactive bladder.    At visit on 08/04/16 had 2.5 cm mass at 3:00 left breast.  Had diagnostic mammogram and [place] left breast and had no mass.  She states she did not have a clinical breast exam at the time of the mammogram/U/S. Patient is doing self exams and does not feel any change in the left breast.  She is checking regularly.  Has routine mammogram this month.  Started on Myrbetriq. Was drinking a lot of lemon water prior to starting medication.  Stopped this.  One coffee per day in the morning.  Voiding less often but still has urgency.  Taking regularly at night. Cannot control the urge.  No dysuria or hematuria.  GYNECOLOGIC HISTORY: Patient's last menstrual period was 10/01/2006 (exact date). Contraception:  Postmenopausal Menopausal hormone therapy:  None Last mammogram: 10-06-15 Density D/Neg/BiRads1: [facility]--08-09-16 Lt.Br.U/S and Lt. Br.Diag.for palpable mass;Density C/Neg/BiRads1:TBC Last pap smear: 08-04-16 Neg:Neg HR HPV          OB History    Gravida Para Term Preterm AB Living   3 3 3          SAB TAB Ectopic Multiple Live Births           3         There are no active problems to display for this patient.   Past Medical History:  Diagnosis Date  . GERD (gastroesophageal reflux disease)   . Glaucoma   . H/O cervical polypectomy   . History of kidney stones   . Hyperlipidemia   . Medical history non-contributory     Past Surgical History:  Procedure Laterality Date  . NO PAST SURGERIES    . ORIF ULNAR FRACTURE Left 10/21/2012   Procedure: OPEN REDUCTION INTERNAL FIXATION left elbow medial epicondyle and ulnar nerve release ;  Surgeon: Dominica Severin, MD;  Location: [facility] OR;  Service: Orthopedics;  Laterality: Left;  OPEN REDUCTION INTERNAL FIXATION  left elbow medial epicondyle and ulnar nerve release     Current Outpatient Prescriptions  Medication Sig Dispense Refill  . mirabegron ER (MYRBETRIQ) 25 MG TB24 tablet Take 1 tablet (25 mg total) by mouth daily. 30 tablet 1  . simvastatin (ZOCOR) 10 MG tablet Take 1 tablet by mouth daily.     No current facility-administered medications for this visit.      ALLERGIES: Patient has no known allergies.  Family History  Problem Relation Age of Onset  . Hypertension Father   . Stroke Father   . Ovarian cancer Maternal Grandmother 19    dec ovarian ca  . Diabetes Paternal Grandfather     Social History   Social History  . Marital status: Married    Spouse name: N/A  . Number of children: N/A  . Years of education: N/A   Occupational History  . Not on file.   Social History Main Topics  . Smoking status: Never Smoker  . Smokeless tobacco: Never Used  . Alcohol use 1.2 oz/week    2 Glasses of wine per week     Comment: occ  . Drug use: No  . Sexual activity: Yes    Partners: Male    Birth control/ protection: Post-menopausal   Other Topics Concern  . Not on file   Social History Narrative  .  No narrative on file    ROS:  Pertinent items are noted in HPI.  PHYSICAL EXAMINATION:    BP 122/78 (BP Location: Right Arm, Patient Position: Sitting, Cuff Size: Normal)   Pulse 66   Ht 5\' 4"  (1.626 m)   Wt 151 lb (68.5 kg)   LMP 10/01/2006 (Exact Date)   BMI 25.92 kg/m     General appearance: alert, cooperative and appears stated age   Breasts: normal appearance, no masses or tenderness, No nipple retraction or dimpling, No nipple discharge or bleeding, No axillary or supraclavicular adenopathy   Chaperone was present for exam.  ASSESSMENT  Normal breast exam.  Overactive bladder.   PLAN  Return to routine mammogram screening.  Increase Myrbetriq to 50 mg and take in the morning.  Declines physical therapy for the bladder. She understands that there are  other options for treating urgency/frequency.  Follow up in 6 weeks.   An After Visit Summary was printed and given to the patient.  __15____ minutes face to face time of which over 50% was spent in counseling.

## 2016-09-15 ENCOUNTER — Encounter: Payer: Self-pay | Admitting: Obstetrics and Gynecology

## 2016-09-15 ENCOUNTER — Ambulatory Visit (INDEPENDENT_AMBULATORY_CARE_PROVIDER_SITE_OTHER): Payer: BLUE CROSS/BLUE SHIELD | Admitting: Obstetrics and Gynecology

## 2016-09-15 VITALS — BP 122/78 | HR 66 | Ht 64.0 in | Wt 151.0 lb

## 2016-09-15 DIAGNOSIS — N3281 Overactive bladder: Secondary | ICD-10-CM

## 2016-09-15 DIAGNOSIS — Z87898 Personal history of other specified conditions: Secondary | ICD-10-CM | POA: Diagnosis not present

## 2016-09-15 MED ORDER — MIRABEGRON ER 50 MG PO TB24: 50.0000 mg | ORAL_TABLET | Freq: Every day | ORAL | 10 refills | Status: DC

## 2016-09-28 ENCOUNTER — Ambulatory Visit: Payer: BLUE CROSS/BLUE SHIELD

## 2016-10-14 DIAGNOSIS — Z1231 Encounter for screening mammogram for malignant neoplasm of breast: Secondary | ICD-10-CM

## 2016-10-27 ENCOUNTER — Encounter: Payer: Self-pay | Admitting: Obstetrics and Gynecology

## 2016-10-27 ENCOUNTER — Ambulatory Visit (INDEPENDENT_AMBULATORY_CARE_PROVIDER_SITE_OTHER): Payer: BLUE CROSS/BLUE SHIELD | Admitting: Obstetrics and Gynecology

## 2016-10-27 VITALS — BP 102/68 | HR 78 | Resp 16 | Ht 64.0 in | Wt 151.0 lb

## 2016-10-27 DIAGNOSIS — N3281 Overactive bladder: Secondary | ICD-10-CM

## 2016-10-27 NOTE — Progress Notes (Signed)
GYNECOLOGY  VISIT   HPI: 56 y.o.   Married  Caucasian  female   G3P3000 with Patient's last menstrual period was 10/01/2006 (exact date).   here for  Follow up - myrbetriq. Increased to 50 mg on 09/15/16 and developed dizziness and dry mouth.  Stopped after 2.5 weeks.   Frequency returned.  Leaked with washing dishes.   No leakage with coughing.  Avoids drinking water in order to not leak water.  Has early glaucoma.   GYNECOLOGIC HISTORY: Patient's last menstrual period was 10/01/2006 (exact date). Contraception:  Post menopausal  Menopausal hormone therapy:  None Last mammogram: 10/12/16 BIRADS1:Neg  Last pap smear:   08/04/16 Neg. HR HPV:neg        OB History    Gravida Para Term Preterm AB Living   SAB TAB Ectopic Multiple Live Births           3         There are no active problems to display for this patient.   Past Medical History:  Diagnosis Date  . GERD (gastroesophageal reflux disease)   . Glaucoma   . H/O cervical polypectomy   . History of kidney stones   . Hyperlipidemia   . Medical history non-contributory     Past Surgical History:  Procedure Laterality Date  . NO PAST SURGERIES    . ORIF ULNAR FRACTURE Left 10/21/2012   Procedure: OPEN REDUCTION INTERNAL FIXATION left elbow medial epicondyle and ulnar nerve release ;  Surgeon: Dominica Severin, MD;  Location: [facility] OR;  Service: Orthopedics;  Laterality: Left;  OPEN REDUCTION INTERNAL FIXATION left elbow medial epicondyle and ulnar nerve release     Current Outpatient Prescriptions  Medication Sig Dispense Refill  . simvastatin (ZOCOR) 10 MG tablet Take 1 tablet by mouth daily.     No current facility-administered medications for this visit.      ALLERGIES: Patient has no known allergies.  Family History  Problem Relation Age of Onset  . Hypertension Father   . Stroke Father   . Ovarian cancer Maternal Grandmother 61    dec ovarian ca  . Diabetes Paternal Grandfather     Social  History   Social History  . Marital status: Married    Spouse name: N/A  . Number of children: N/A  . Years of education: N/A   Occupational History  . Not on file.   Social History Main Topics  . Smoking status: Never Smoker  . Smokeless tobacco: Never Used  . Alcohol use 1.2 oz/week    2 Glasses of wine per week     Comment: occ  . Drug use: No  . Sexual activity: Yes    Partners: Male    Birth control/ protection: Post-menopausal   Other Topics Concern  . Not on file   Social History Narrative  . No narrative on file    ROS:  Pertinent items are noted in HPI.  PHYSICAL EXAMINATION:    BP 102/68 (BP Location: Right Arm, Patient Position: Sitting, Cuff Size: Normal)   Pulse 78   Resp 16   Ht  (1.626 m)   Wt 151 lb (68.5 kg)   LMP 10/01/2006 (Exact Date)   BMI 25.92 kg/m     General appearance: alert, cooperative and appears stated age   ASSESSMENT  Overactive bladder.  Intolerance to Myrbetriq. "Early glaucoma."  PLAN  Discussion of types or urinary incontinence. Will refer  to [employer].  Patient will send a copy of her next visit with opthamologist to me so we can scan in her record. I would like to know if this is closed angle glaucoma.  It sounds like it is not, but I would like confirmation.  Follow up prn.    An After Visit Summary was printed and given to the patient.  _____15_____ minutes face to face time of which over 50% was spent in counseling.

## 2017-01-11 ENCOUNTER — Emergency Department (HOSPITAL_BASED_OUTPATIENT_CLINIC_OR_DEPARTMENT_OTHER)
Admission: EM | Admit: 2017-01-11 | Discharge: 2017-01-11 | Disposition: A | Discharge status: Home / Self Care | Payer: BLUE CROSS/BLUE SHIELD | Attending: Emergency Medicine | Admitting: Emergency Medicine

## 2017-01-11 ENCOUNTER — Emergency Department (HOSPITAL_BASED_OUTPATIENT_CLINIC_OR_DEPARTMENT_OTHER): Payer: BLUE CROSS/BLUE SHIELD

## 2017-01-11 ENCOUNTER — Encounter (HOSPITAL_BASED_OUTPATIENT_CLINIC_OR_DEPARTMENT_OTHER): Payer: Self-pay

## 2017-01-11 DIAGNOSIS — R1032 Left lower quadrant pain: Secondary | ICD-10-CM | POA: Diagnosis present

## 2017-01-11 DIAGNOSIS — N23 Unspecified renal colic: Secondary | ICD-10-CM | POA: Insufficient documentation

## 2017-01-11 DIAGNOSIS — Z79899 Other long term (current) drug therapy: Secondary | ICD-10-CM | POA: Diagnosis not present

## 2017-01-11 HISTORY — DX: Calculus of kidney: N20.0

## 2017-01-11 LAB — CBC WITH DIFFERENTIAL/PLATELET
BASOS ABS: 0 10*3/uL (ref 0.0–0.1)
BASOS PCT: 0 %
EOS ABS: 0.3 10*3/uL (ref 0.0–0.7)
EOS PCT: 5 %
HCT: 35.6 % — ABNORMAL LOW (ref 36.0–46.0)
Hemoglobin: 12.1 g/dL (ref 12.0–15.0)
Lymphocytes Relative: 31 %
Lymphs Abs: 2.2 10*3/uL (ref 0.7–4.0)
MCH: 30.1 pg (ref 26.0–34.0)
MCHC: 34 g/dL (ref 30.0–36.0)
MCV: 88.6 fL (ref 78.0–100.0)
Monocytes Absolute: 0.6 10*3/uL (ref 0.1–1.0)
Monocytes Relative: 9 %
Neutro Abs: 3.8 10*3/uL (ref 1.7–7.7)
Neutrophils Relative %: 55 %
PLATELETS: 208 10*3/uL (ref 150–400)
RBC: 4.02 MIL/uL (ref 3.87–5.11)
RDW: 13 % (ref 11.5–15.5)
WBC: 7 10*3/uL (ref 4.0–10.5)

## 2017-01-11 LAB — BASIC METABOLIC PANEL
Anion gap: 7 (ref 5–15)
BUN: 19 mg/dL (ref 6–20)
CO2: 25 mmol/L (ref 22–32)
CREATININE: 0.83 mg/dL (ref 0.44–1.00)
Calcium: 9.3 mg/dL (ref 8.9–10.3)
Chloride: 103 mmol/L (ref 101–111)
Glucose, Bld: 108 mg/dL — ABNORMAL HIGH (ref 65–99)
Potassium: 3.7 mmol/L (ref 3.5–5.1)
SODIUM: 135 mmol/L (ref 135–145)

## 2017-01-11 LAB — PREGNANCY, URINE: Preg Test, Ur: POSITIVE — AB

## 2017-01-11 LAB — URINALYSIS, ROUTINE W REFLEX MICROSCOPIC
Bilirubin Urine: NEGATIVE
Glucose, UA: NEGATIVE mg/dL
KETONES UR: NEGATIVE mg/dL
NITRITE: NEGATIVE
PH: 5 (ref 5.0–8.0)
Protein, ur: 30 mg/dL — AB
Specific Gravity, Urine: 1.024 (ref 1.005–1.030)

## 2017-01-11 LAB — URINALYSIS, MICROSCOPIC (REFLEX)

## 2017-01-11 LAB — HCG, QUANTITATIVE, PREGNANCY: HCG, BETA CHAIN, QUANT, S: 6 m[IU]/mL — AB (ref <5)

## 2017-01-11 MED ORDER — OXYCODONE-ACETAMINOPHEN 5-325 MG PO TABS
2.0000 | ORAL_TABLET | Freq: Once | ORAL | Status: AC
  Administered 2017-01-11: 2 via ORAL
  Filled 2017-01-11: qty 2

## 2017-01-11 MED ORDER — IBUPROFEN 400 MG PO TABS
600.0000 mg | ORAL_TABLET | Freq: Once | ORAL | Status: AC
  Administered 2017-01-11: 600 mg via ORAL
  Filled 2017-01-11: qty 1

## 2017-01-11 MED ORDER — IBUPROFEN 600 MG PO TABS: 600.0000 mg | ORAL_TABLET | Freq: Three times a day (TID) | ORAL | 0 refills | Status: DC | PRN

## 2017-01-11 MED ORDER — ONDANSETRON HCL 4 MG/2ML IJ SOLN
4.0000 mg | Freq: Once | INTRAMUSCULAR | Status: AC
  Administered 2017-01-11: 4 mg via INTRAVENOUS
  Filled 2017-01-11: qty 2

## 2017-01-11 MED ORDER — OXYCODONE-ACETAMINOPHEN 5-325 MG PO TABS: 1.0000 | ORAL_TABLET | ORAL | 0 refills | Status: DC | PRN

## 2017-01-11 MED ORDER — MORPHINE SULFATE (PF) 4 MG/ML IV SOLN
4.0000 mg | Freq: Once | INTRAVENOUS | Status: AC
  Administered 2017-01-11: 4 mg via INTRAVENOUS
  Filled 2017-01-11: qty 1

## 2017-01-11 NOTE — ED Provider Notes (Signed)
[facility] Provider Note   CSN: [id number] Arrival date & time: 01/11/17  0014  By signing my name below, I, Rosana Fretana Waskiewicz, attest that this documentation has been prepared under the direction and in the presence of Pricilla LovelessGoldston, Joanne Salah, MD. Electronically Signed: Rosana Fretana Waskiewicz, ED Scribe. 01/11/17. 12:52 AM.  History   Chief Complaint Chief Complaint  Patient presents with  . Flank Pain    The history is provided by the patient. No language interpreter was used.  HPI Comments: Loretta Hodges is a 56 y.o. female with a PMHx of kidney stones who presents to the Emergency Department complaining of constant moderate left flank plain onset 2 hours ago. She describes her pain as shooting to her left lower abdomen and rates her pain to be 6/10. Per pt, her associated symptoms include nausea  and vomiting. Pain is now mostly in lower abdomen, left sided.  Pt denies dysuria, fever, urinary frequency, or diarrhea.    Past Medical History:  Diagnosis Date  . GERD (gastroesophageal reflux disease)   . Glaucoma   . H/O cervical polypectomy   . History of kidney stones   . Hyperlipidemia   . Kidney stone     There are no active problems to display for this patient.   Past Surgical History:  Procedure Laterality Date  . NO PAST SURGERIES    . ORIF ULNAR FRACTURE Left 10/21/2012   Procedure: OPEN REDUCTION INTERNAL FIXATION left elbow medial epicondyle and ulnar nerve release ;  Surgeon: Dominica SeverinWilliam Gramig, MD;  Location: [facility];  Service: Orthopedics;  Laterality: Left;  OPEN REDUCTION INTERNAL FIXATION left elbow medial epicondyle and ulnar nerve release     OB History    Gravida Para Term Preterm AB Living   3 3 3          SAB TAB Ectopic Multiple Live Births           3       Home Medications    Prior to Admission medications   Medication Sig Start Date End Date Taking? Authorizing Provider  ibuprofen (ADVIL,MOTRIN) 600 MG tablet Take 1 tablet (600 mg total) by  mouth every 8 (eight) hours as needed. 01/11/17   Pricilla LovelessGoldston, Sayde Lish, MD  oxyCODONE-acetaminophen (PERCOCET) 5-325 MG tablet Take 1-2 tablets by mouth every 4 (four) hours as needed for severe pain. 01/11/17   Pricilla LovelessGoldston, Troyce Febo, MD  simvastatin (ZOCOR) 10 MG tablet Take 1 tablet by mouth daily. 07/14/16   [provider]    Family History Family History  Problem Relation Age of Onset  . Hypertension Father   . Stroke Father   . Ovarian cancer Maternal Grandmother 2875    dec ovarian ca  . Diabetes Paternal Grandfather     Social History Social History  Substance Use Topics  . Smoking status: Never Smoker  . Smokeless tobacco: Never Used  . Alcohol use 1.2 oz/week    2 Glasses of wine per week     Comment: occ     Allergies   Patient has no known allergies.   Review of Systems Review of Systems  Constitutional: Negative for fever.  Gastrointestinal: Positive for nausea and vomiting. Negative for diarrhea.  Genitourinary: Positive for flank pain (left-sided). Negative for dysuria and frequency.  All other systems reviewed and are negative.    Physical Exam Updated Vital Signs BP 132/82 (BP Location: Left Arm)   Pulse 70   Temp 98.3 F (36.8 C) (Oral)   Resp 18  Ht 5\' 4"  (1.626 m)   Wt 147 lb 11.3 oz (67 kg)   LMP 10/01/2006 (Exact Date)   SpO2 100%   BMI 25.35 kg/m   Physical Exam  Constitutional: She is oriented to person, place, and time. She appears well-developed and well-nourished.  Pacing around the room.   HENT:  Head: Normocephalic and atraumatic.  Right Ear: External ear normal.  Left Ear: External ear normal.  Nose: Nose normal.  Eyes: Right eye exhibits no discharge. Left eye exhibits no discharge.  Cardiovascular: Normal rate, regular rhythm and normal heart sounds.   Pulmonary/Chest: Effort normal and breath sounds normal.  Abdominal: Soft. There is tenderness.  Mild left flank tenderness. Mild LLQ tenderness  Neurological: She is alert and  oriented to person, place, and time.  Skin: Skin is warm and dry. She is not diaphoretic.  Nursing note and vitals reviewed.    ED Treatments / Results  DIAGNOSTIC STUDIES: Oxygen Saturation is 100% on RA, normal by my interpretation.   COORDINATION OF CARE: 12:53 AM-Discussed next steps with pt. Pt verbalized understanding and is agreeable with the plan.  Labs (all labs ordered are listed, but only abnormal results are displayed) Labs Reviewed  URINALYSIS, ROUTINE W REFLEX MICROSCOPIC - Abnormal; Notable for the following:       Result Value   Hgb urine dipstick TRACE (*)    Protein, ur 30 (*)    Leukocytes, UA MODERATE (*)    All other components within normal limits  PREGNANCY, URINE - Abnormal; Notable for the following:    Preg Test, Ur WEAK POSITIVE (*)    All other components within normal limits  URINALYSIS, MICROSCOPIC (REFLEX) - Abnormal; Notable for the following:    Bacteria, UA FEW (*)    Squamous Epithelial / LPF 0-5 (*)    All other components within normal limits  BASIC METABOLIC PANEL - Abnormal; Notable for the following:    Glucose, Bld 108 (*)    All other components within normal limits  CBC WITH DIFFERENTIAL/PLATELET - Abnormal; Notable for the following:    HCT 35.6 (*)    All other components within normal limits  HCG, QUANTITATIVE, PREGNANCY - Abnormal; Notable for the following:    hCG, Beta Chain, Quant, S 6 (*)    All other components within normal limits  URINE CULTURE    EKG  EKG Interpretation None       Radiology Ct Renal Stone Study  Result Date: 01/11/2017 CLINICAL DATA:  Left flank and groin pain for 3 hours. EXAM: CT ABDOMEN AND PELVIS WITHOUT CONTRAST TECHNIQUE: Multidetector CT imaging of the abdomen and pelvis was performed following the standard protocol without IV contrast. COMPARISON:  CT 02/14/2015 FINDINGS: Lower chest: 4 mm left level pulmonary nodule, stable dating back to 2013 and considered benign. Tiny 3 mm right  middle lobe pulmonary nodule series 3 image 4. This is not definitively seen on prior. Tiny subpleural density in the right middle lobe is unchanged. Dependent atelectasis. Hepatobiliary: No focal liver abnormality is seen. No gallstones, gallbladder wall thickening, or biliary dilatation. Pancreas: No ductal dilatation or inflammation. Spleen: Normal in size without focal abnormality. Adrenals/Urinary Tract: No adrenal nodule. 4 mm obstructing stone at the left ureterovesicular junction with moderate proximal hydroureteronephrosis. Mild left perinephric edema. Multiple, at least 3, nonobstructing stones in the left kidney. Multiple, at least 5, nonobstructing stones in the right kidney. No right hydronephrosis. No right ureteral calculi. Urinary bladder is nondistended. No bladder stone. Stomach/Bowel: Stomach  is decompressed. No small bowel dilatation or inflammation. Moderate stool burden throughout the colon without colonic wall thickening. Normal appendix. Vascular/Lymphatic: Normal caliber abdominal aorta. No abdominal or pelvic adenopathy. Reproductive: Uterus and bilateral adnexa are unremarkable. Other: No free air, free fluid, or intra-abdominal fluid collection. Small fat containing umbilical hernia. Musculoskeletal: There are no acute or suspicious osseous abnormalities. IMPRESSION: 1. Obstructing 4 mm stone at the left ureterovesicular junction with moderate hydronephrosis. 2. Multiple additional nonobstructing bilateral renal calculi. 3. Pulmonary nodules at the lung bases, at least 2 of which are stable prior exams. A tiny (less than 4 mm) right middle lobe nodule was not seen on priors, however may not have been included in the field of view. No follow-up needed if patient is low-risk (and has no known or suspected primary neoplasm). Non-contrast chest CT can be considered in 12 months if patient is high-risk. This recommendation follows the consensus statement: Guidelines for Management of  Incidental Pulmonary Nodules Detected on CT Images: From the Fleischner Society 2017; Radiology 2017; 284:228-243. Electronically Signed   By: Rubye Oaks M.D.   On: 01/11/2017 02:01    Procedures Procedures (including critical care time)  Medications Ordered in ED Medications  oxyCODONE-acetaminophen (PERCOCET/ROXICET) 5-325 MG per tablet 2 tablet (not administered)  ibuprofen (ADVIL,MOTRIN) tablet 600 mg (not administered)  morphine 4 MG/ML injection 4 mg (4 mg Intravenous Given 01/11/17 0109)  ondansetron (ZOFRAN) injection 4 mg (4 mg Intravenous Given 01/11/17 0109)     Initial Impression / Assessment and Plan / ED Course  I have reviewed the triage vital signs and the nursing notes.  Pertinent labs & imaging results that were available during my care of the patient were reviewed by me and considered in my medical decision making (see chart for details).  Clinical Course as of Jan 11 209  Tue Jan 11, 2017  0055 Lab reports her pregnancy test is equivocal. Patient tells me she's post-menopausal. Will get lab HCG for clarification, likely lab error  [SG]  0136 Hcg is 6. I discussed more with patient, she has been in menopause for 8 years. I think pregnancy is highly unlikely, as does she. Unclear why she has this low level of Hcg. After discussion with patient, will proceed with CT given concern for pregnancy is quite low.  [SG]    Clinical Course User Index [SG] Pricilla Loveless, MD    CT confirms left ureteral stone at the UVJ. Patient is feeling a little better, no current vomiting. Feels well and up to go home with oral pain control. Discussed strict return precautions including worsening pain, vomiting, fevers. She does not have any urinary tract infection symptoms, we'll send urinalysis for culture but I think an infected stone is highly unlikely. Normal white blood cell count. She has a urologist, advised follow-up with them. Discussed return precautions.  Final Clinical  Impressions(s) / ED Diagnoses   Final diagnoses:  Ureteral colic    New Prescriptions New Prescriptions   IBUPROFEN (ADVIL,MOTRIN) 600 MG TABLET    Take 1 tablet (600 mg total) by mouth every 8 (eight) hours as needed.   OXYCODONE-ACETAMINOPHEN (PERCOCET) 5-325 MG TABLET    Take 1-2 tablets by mouth every 4 (four) hours as needed for severe pain.   I personally performed the services described in this documentation, which was scribed in my presence. The recorded information has been reviewed and is accurate.     Pricilla Loveless, MD 01/11/17 [phone]

## 2017-01-11 NOTE — ED Notes (Signed)
Patient transported to CT 

## 2017-01-11 NOTE — ED Notes (Signed)
Pt passed her kidney stone in the bathroom at discharge.  She verbalizes understanding of dc instructions and denies any further needs at this time

## 2017-01-11 NOTE — ED Triage Notes (Signed)
Pt c/o left flank pain that radiates to left lower abdomen with n/v for the last two hours, states it feels like a kidney stone

## 2017-01-12 LAB — URINE CULTURE: CULTURE: NO GROWTH

## 2017-01-27 IMAGING — MG 2D DIGITAL DIAGNOSTIC UNILATERAL LEFT MAMMOGRAM WITH CAD AND ADJ
9 series · 9 of 21 positions shown · non-contrast
Comparison: Previous exam(s).

CLINICAL DATA: Patient's physician palpated an abnormality in the
left breast.

EXAM:
2D DIGITAL DIAGNOSTIC LEFT MAMMOGRAM WITH CAD AND ADJUNCT TOMO
ULTRASOUND LEFT BREAST

[L MLO synth-2D]
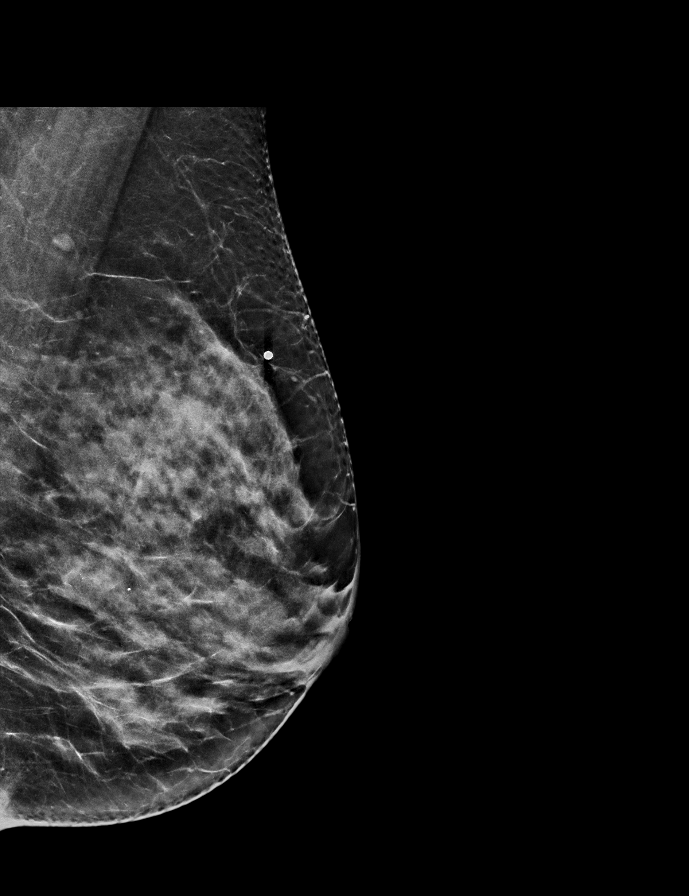

[L CC synth-2D]
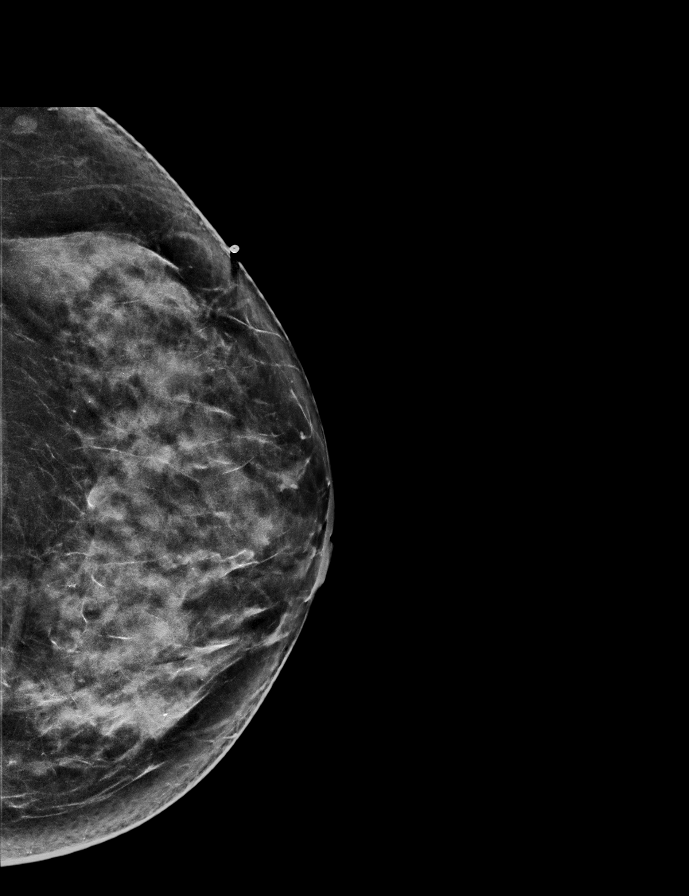

[L MLO]
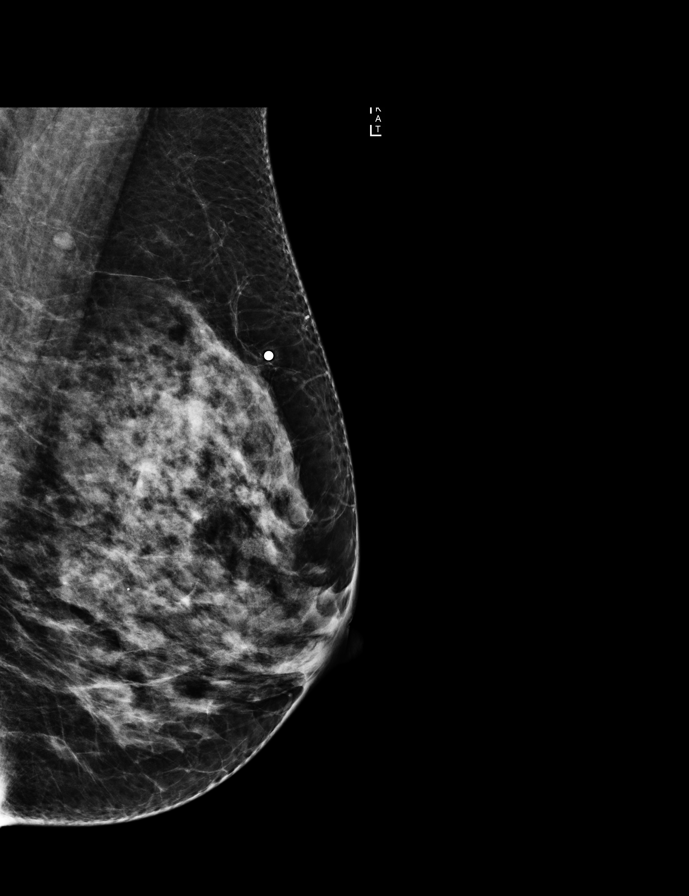

[L TAN]
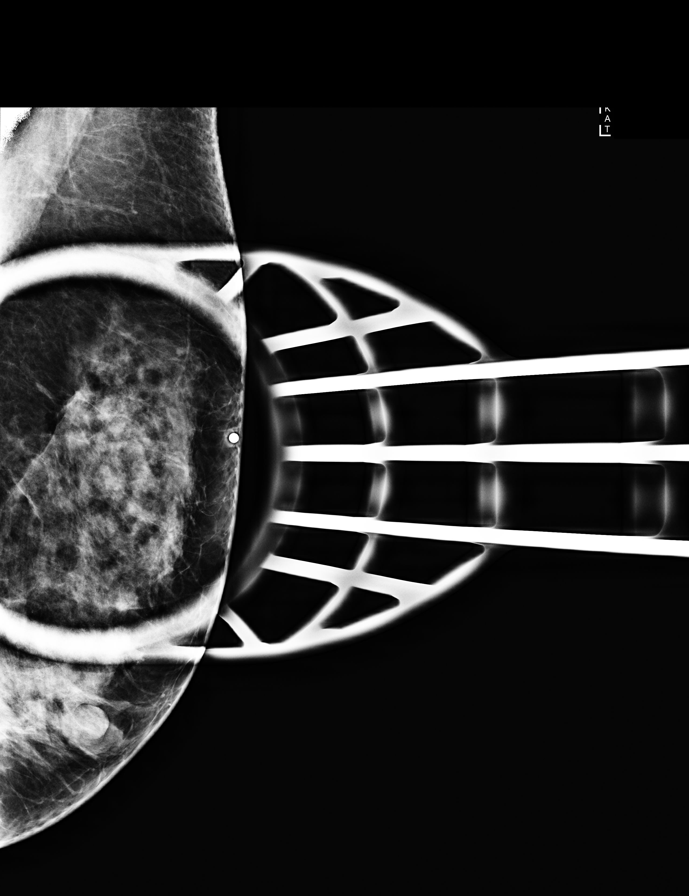

[L CC]
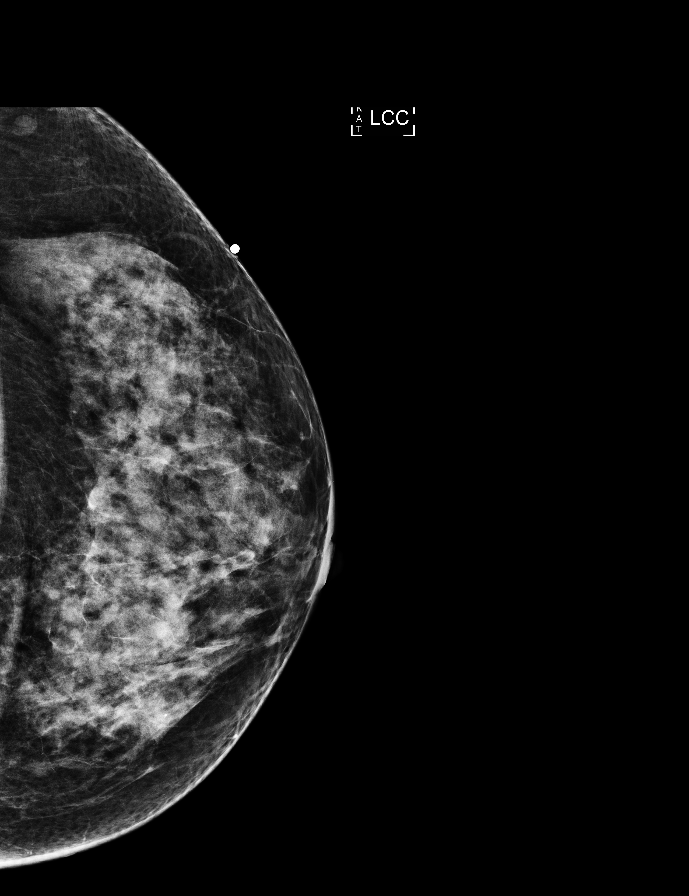

[L TAN synth-2D]
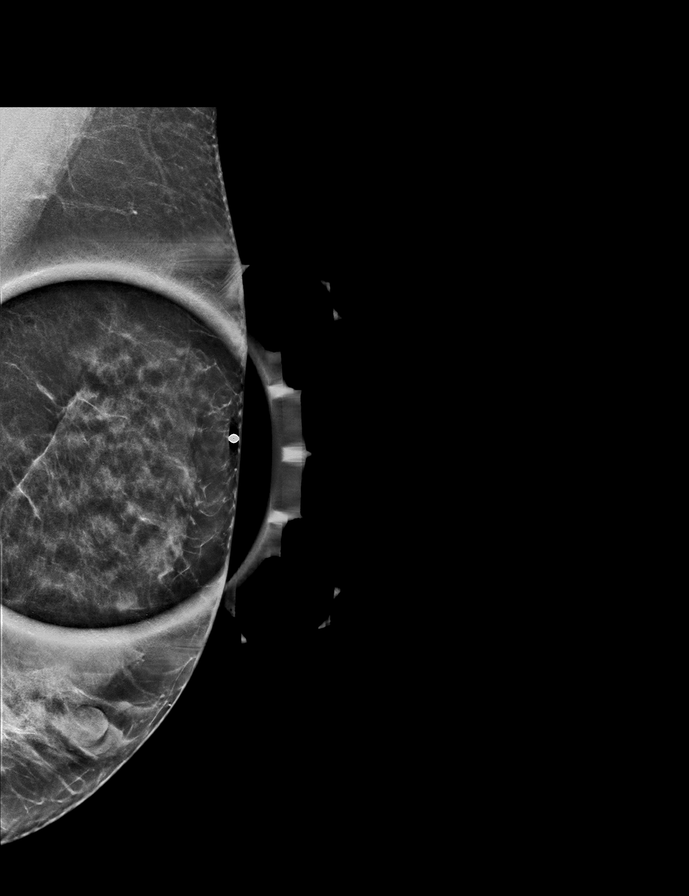

[L CC tomo · tomo slice 35/69.0]
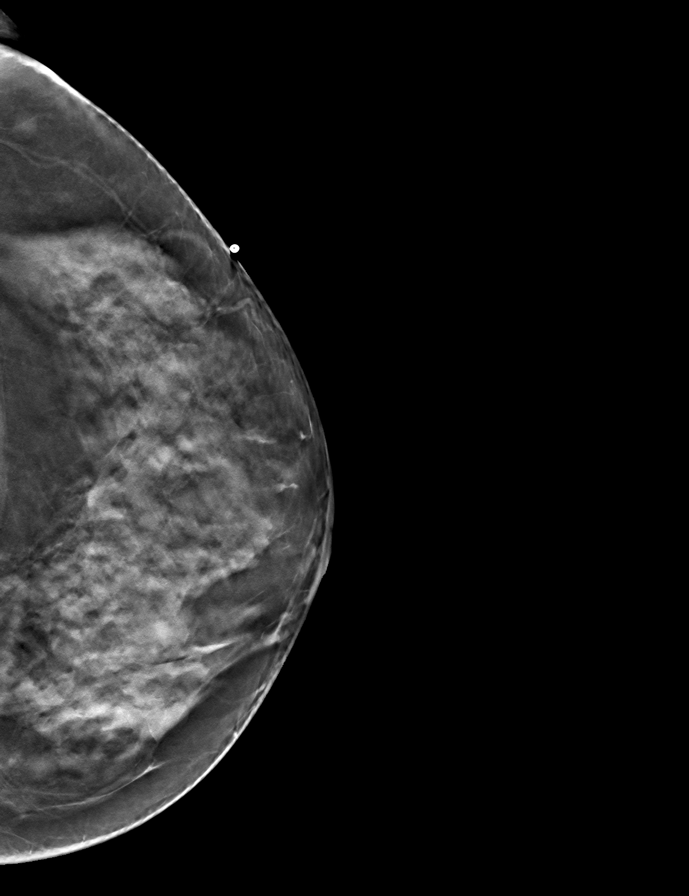

[L MLO tomo · tomo slice 33/65.0]
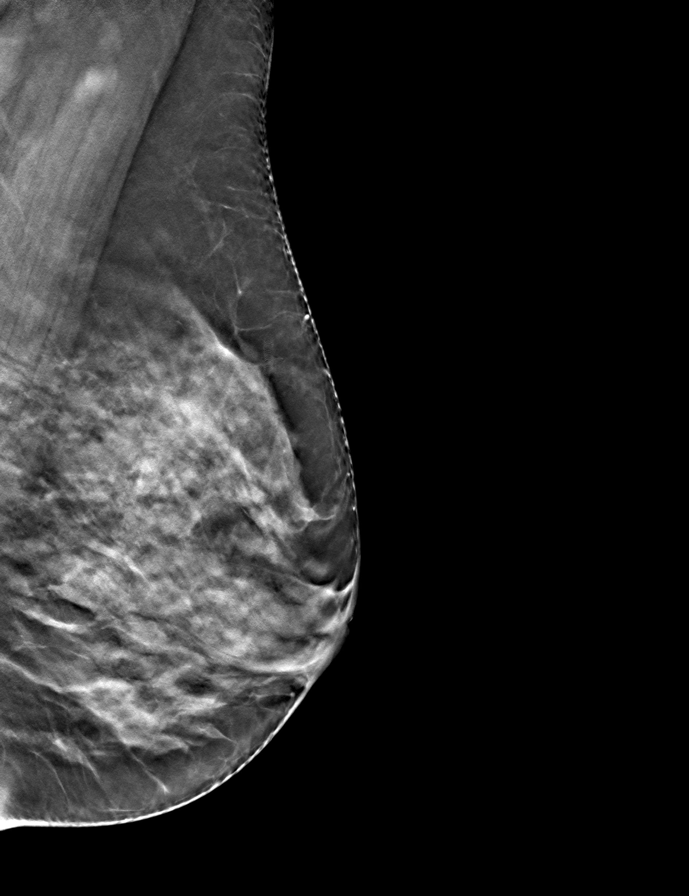

[L TAN tomo · tomo slice 27/53.0]
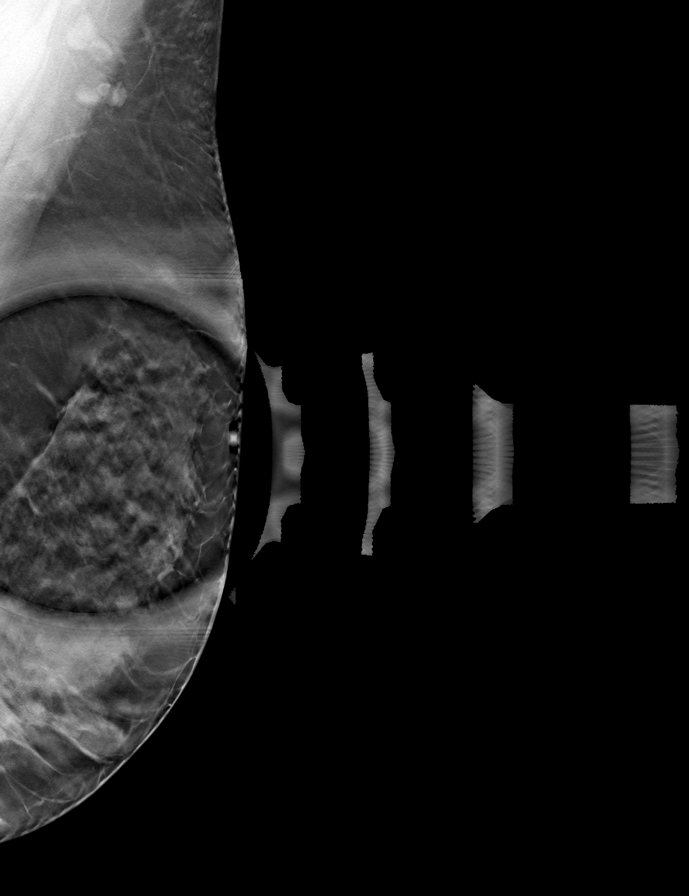

[9 of 21 positions shown; findings below may reference images not displayed]

ACR Breast Density Category c: The breast tissue is heterogeneously
dense, which may obscure small masses.
FINDINGS: No suspicious mass, malignant type microcalcifications or distortion
detected in the left breast.

Mammographic images were processed with CAD.

On physical exam, I do not palpate a discrete mass in the
upper-outer quadrant of the left breast.

Targeted ultrasound is performed, showing normal tissue in the
upper-outer quadrant of the left breast. No solid or cystic mass,
abnormal shadowing or distortion visualized.
IMPRESSION: No evidence of malignancy in the left breast.

RECOMMENDATION:
Bilateral screening mammogram in it Tuesday September, 2016 is recommended.

I have discussed the findings and recommendations with the patient.
Results were also provided in writing at the conclusion of the
visit. If applicable, a reminder letter will be sent to the patient
regarding the next appointment.

BI-RADS CATEGORY  1: Negative.

## 2017-08-10 ENCOUNTER — Encounter: Payer: Self-pay | Admitting: Obstetrics and Gynecology

## 2017-08-10 ENCOUNTER — Other Ambulatory Visit: Payer: Self-pay

## 2017-08-10 ENCOUNTER — Ambulatory Visit (INDEPENDENT_AMBULATORY_CARE_PROVIDER_SITE_OTHER): Payer: BLUE CROSS/BLUE SHIELD | Admitting: Obstetrics and Gynecology

## 2017-08-10 VITALS — BP 112/64 | HR 72 | Resp 16 | Ht 64.0 in | Wt 153.4 lb

## 2017-08-10 DIAGNOSIS — Z01419 Encounter for gynecological examination (general) (routine) without abnormal findings: Secondary | ICD-10-CM | POA: Diagnosis not present

## 2017-08-10 NOTE — Progress Notes (Signed)
56 y.o. 553P3000 Married Caucasian [place] female here for annual exam.    Denies vaginal bleeding.   Has overactive bladder, which is now better.  Intolerance to Myrbetriq.  Has early glaucoma.  Discontinued her PT at [facility] because she is doing all of the same exercises at her gym.   Labs with PCP.  Took flu vaccine.   PCP: Bernadette Hoitafaela Aguiar, MD    Patient's last menstrual period was 10/01/2006 (exact date).           Sexually active: Yes.   female The current method of family planning is post menopausal status.    Exercising: Yes.    Cardio and weights Smoker:  no  Health Maintenance: Pap: 08-04-16 Neg:Neg HR HPV, 2015 normal per patient. History of abnormal Pap:  no MMG: 10-12-16 Density C/Neg/BiRads1:[facility] [place].  Had dx left mammogram and left breast US which were normal on 08/09/17. Colonoscopy:  01/2017 normal in [place];next due 01/2022. BMD:   n/a  Result  n/a TDaP:  2011 Gardasil:   no HIV: never Hep C: Neg with PCP 2017 Screening Labs:  Hb today: PCP, Urine today: not done   reports that  has never smoked. she has never used smokeless tobacco. She reports that she drinks about 1.2 oz of alcohol per week. She reports that she does not use drugs.  Past Medical History:  Diagnosis Date  . GERD (gastroesophageal reflux disease)   . Glaucoma   . H/O cervical polypectomy   . History of kidney stones   . Hyperlipidemia   . Kidney stone     Past Surgical History:  Procedure Laterality Date  . NO PAST SURGERIES    . ORIF ULNAR FRACTURE Left 10/21/2012   Procedure: OPEN REDUCTION INTERNAL FIXATION left elbow medial epicondyle and ulnar nerve release ;  Surgeon: Dominica SeverinWilliam Gramig, MD;  Location: [facility] OR;  Service: Orthopedics;  Laterality: Left;  OPEN REDUCTION INTERNAL FIXATION left elbow medial epicondyle and ulnar nerve release     Current Outpatient Medications  Medication Sig Dispense Refill  . simvastatin (ZOCOR) 10 MG tablet Take 1 tablet by mouth  daily.     No current facility-administered medications for this visit.     Family History  Problem Relation Age of Onset  . Hypertension Father   . Stroke Father   . Ovarian cancer Maternal Grandmother 5075       dec ovarian ca  . Diabetes Paternal Grandfather     ROS:  Pertinent items are noted in HPI.  Otherwise, a comprehensive ROS was negative.  Exam:   BP 112/64 (BP Location: Right Arm, Patient Position: Sitting, Cuff Size: Normal)   Pulse 72   Resp 16   Ht 5\' 4"  (1.626 m)   Wt 153 lb 6.4 oz (69.6 kg)   LMP 10/01/2006 (Exact Date)   BMI 26.33 kg/m     General appearance: alert, cooperative and appears stated age Head: Normocephalic, without obvious abnormality, atraumatic Neck: no adenopathy, supple, symmetrical, trachea midline and thyroid normal to inspection and palpation Lungs: clear to auscultation bilaterally Breasts: normal appearance, no masses or tenderness on right, left breast with 2.5 - 3 cm fullness left upper quadrant (No change. Patient states it is chronically this way), No nipple retraction or dimpling, No nipple discharge or bleeding, No axillary or supraclavicular adenopathy Heart: regular rate and rhythm Abdomen: soft, non-tender; no masses, no organomegaly Extremities: extremities normal, atraumatic, no cyanosis or edema Skin: Skin color, texture, turgor normal. No rashes  or lesions Lymph nodes: Cervical, supraclavicular, and axillary nodes normal. No abnormal inguinal nodes palpated Neurologic: Grossly normal  Pelvic: External genitalia:  no lesions              Urethra:  normal appearing urethra with no masses, tenderness or lesions              Bartholins and Skenes: normal                 Vagina: normal appearing vagina with normal color and discharge, no lesions              Cervix: no lesions              Pap taken: No. Bimanual Exam:  Uterus:  normal size, contour, position, consistency, mobility, non-tender              Adnexa: no mass,  fullness, tenderness              Rectal exam: Yes.  .  Confirms.              Anus:  normal sphincter tone, no lesions  Chaperone was present for exam.  Assessment:   Well woman visit with normal exam. Overactive bladder.   Improved. FH ovarian cancer.  Hx renal stones.  Negative evaluation of left breast.   Stable exam.   Plan: Mammogram screening discussed.  I do not think she needs any additional evaluation.  Recommended self breast awareness. Pap and HR HPV as above. Guidelines for Calcium, Vitamin D, regular exercise program including cardiovascular and weight bearing exercise. Follow up annually and prn.   After visit summary provided.

## 2017-08-10 NOTE — Patient Instructions (Signed)

## 2018-08-23 ENCOUNTER — Ambulatory Visit: Payer: BLUE CROSS/BLUE SHIELD | Admitting: Obstetrics and Gynecology

## 2018-08-23 ENCOUNTER — Other Ambulatory Visit: Payer: Self-pay

## 2018-08-23 ENCOUNTER — Encounter: Payer: Self-pay | Admitting: Obstetrics and Gynecology

## 2018-08-23 VITALS — BP 120/70 | HR 60 | Resp 14 | Ht 64.0 in | Wt 147.2 lb

## 2018-08-23 DIAGNOSIS — Z01419 Encounter for gynecological examination (general) (routine) without abnormal findings: Secondary | ICD-10-CM

## 2018-08-23 NOTE — Progress Notes (Signed)
57 y.o. [id number] Married Caucasian/[place]ian female here for annual exam.    Denies vaginal bleeding.   Bladder much better.   PCP:  Bernadette Hoitafaela Aguiar, MD   Patient's last menstrual period was 10/01/2006 (exact date).           Sexually active: Yes.   female The current method of family planning is post menopausal status.    Exercising: Yes.    running, body pump and yoga. Smoker:  no  Health Maintenance: Pap: 08-04-16 Neg:Neg HR HPV, 2015 normal per patient. History of abnormal Pap:  no MMG:  10-12-16 Neg/density C/BiRads1, 12/15/17 - BI-RADS1, density C at [facility] --Appt. 12/2018 Colonoscopy: 01/2017 normal in [place];next due 01/2022. BMD:   n/a  Result  n/a TDaP:  2011 Gardasil:   no HIV: no Hep C: Neg with PCP 2017 Screening Labs:  Hb today: PCP Flu vaccine - not done yet.    reports that she has never smoked. She has never used smokeless tobacco. She reports current alcohol use of about 2.0 standard drinks of alcohol per week. She reports that she does not use drugs.  Past Medical History:  Diagnosis Date  . GERD (gastroesophageal reflux disease)   . Glaucoma   . H/O cervical polypectomy   . History of kidney stones   . Hyperlipidemia   . Kidney stone     Past Surgical History:  Procedure Laterality Date  . NO PAST SURGERIES    . ORIF ULNAR FRACTURE Left 10/21/2012   Procedure: OPEN REDUCTION INTERNAL FIXATION left elbow medial epicondyle and ulnar nerve release ;  Surgeon: Dominica SeverinWilliam Gramig, MD;  Location: [facility];  Service: Orthopedics;  Laterality: Left;  OPEN REDUCTION INTERNAL FIXATION left elbow medial epicondyle and ulnar nerve release     Current Outpatient Medications  Medication Sig Dispense Refill  . simvastatin (ZOCOR) 10 MG tablet Take 1 tablet by mouth daily.     No current facility-administered medications for this visit.     Family History  Problem Relation Age of Onset  . Hypertension Father   . Stroke Father   . Ovarian cancer Maternal Grandmother  6975       dec ovarian ca  . Diabetes Paternal Grandfather     Review of Systems  All other systems reviewed and are negative.   Exam:   BP 120/70 (BP Location: Right Arm, Patient Position: Sitting, Cuff Size: Normal)   Pulse 60   Resp 14   Ht 5\' 4"  (1.626 m)   Wt 147 lb 3.2 oz (66.8 kg)   LMP 10/01/2006 (Exact Date)   BMI 25.27 kg/m     General appearance: alert, cooperative and appears stated age Head: Normocephalic, without obvious abnormality, atraumatic Neck: no adenopathy, supple, symmetrical, trachea midline and thyroid normal to inspection and palpation Lungs: clear to auscultation bilaterally Breasts: normal appearance, no masses or tenderness, No nipple retraction or dimpling, No nipple discharge or bleeding, No axillary or supraclavicular adenopathy Heart: regular rate and rhythm Abdomen: soft, non-tender; no masses, no organomegaly Extremities: extremities normal, atraumatic, no cyanosis or edema Skin: Skin color, texture, turgor normal. No rashes or lesions Lymph nodes: Cervical, supraclavicular, and axillary nodes normal. No abnormal inguinal nodes palpated Neurologic: Grossly normal  Pelvic: External genitalia:  no lesions              Urethra:  normal appearing urethra with no masses, tenderness or lesions              Bartholins and Skenes: normal  Vagina: normal appearing vagina with normal color and discharge, no lesions              Cervix: no lesions              Pap taken: No. Bimanual Exam:  Uterus:  normal size, contour, position, consistency, mobility, non-tender              Adnexa: no mass, fullness, tenderness              Rectal exam: Yes.  .  Confirms.              Anus:  normal sphincter tone, no lesions  Chaperone was present for exam.  Assessment:   Well woman visit with normal exam. Overactive bladder.   Improved. FH ovarian cancer - maternal GM.  Hx renal stones. Early glaucoma.  Plan: Mammogram  screening. Recommended self breast awareness. Pap and HR HPV as above. Guidelines for Calcium, Vitamin D, regular exercise program including cardiovascular and weight bearing exercise. Flu vaccine recommended.  Follow up annually and prn.   After visit summary provided.

## 2018-08-23 NOTE — Patient Instructions (Signed)

## 2019-09-06 ENCOUNTER — Other Ambulatory Visit: Payer: Self-pay

## 2019-09-10 ENCOUNTER — Other Ambulatory Visit: Payer: Self-pay

## 2019-09-10 ENCOUNTER — Ambulatory Visit (INDEPENDENT_AMBULATORY_CARE_PROVIDER_SITE_OTHER): Payer: BC Managed Care – PPO | Admitting: Obstetrics and Gynecology

## 2019-09-10 ENCOUNTER — Encounter: Payer: Self-pay | Admitting: Obstetrics and Gynecology

## 2019-09-10 VITALS — BP 120/64 | HR 72 | Temp 97.1°F | Resp 20 | Ht 64.0 in | Wt 147.6 lb

## 2019-09-10 DIAGNOSIS — N393 Stress incontinence (female) (male): Secondary | ICD-10-CM | POA: Diagnosis not present

## 2019-09-10 DIAGNOSIS — Z01419 Encounter for gynecological examination (general) (routine) without abnormal findings: Secondary | ICD-10-CM | POA: Diagnosis not present

## 2019-09-10 NOTE — Patient Instructions (Signed)

## 2019-09-10 NOTE — Progress Notes (Signed)
59 y.o. G80P3000 Married Caucasian/Brazilian female here for annual exam.    Having urinary incontinence and urgency.  She is ready to do surgery.  Did physical therapy.   Denies constipation and fecal incontinence.  Taking vit D and calcium.  PCP:   Bernadette Hoit, MD  Patient's last menstrual period was 10/01/2006 (exact date).           Sexually active: Yes.    The current method of family planning is post menopausal status.    Exercising: No.  The patient does not participate in regular exercise at present. Smoker:  no  Health Maintenance: Pap: 08-04-16 Neg:Neg HR HPV, 2015 normal per patient. History of abnormal Pap:  no MMG: 03-22-19 Rt.Br.poss.asymmetry;Lt.Br.neg/density D--further studies rec.Diag.Rt.with [place] Neg/BiRads 2--Routine screening next year/[facility] Colonoscopy:  01/2017 normal in [place];next due 01/2022. BMD:   n/a  Result  n/a TDaP: 08-04-19 Gardasil:   no HIV:No Hep C: Neg with PCP 2017 Screening Labs:   November 2020. Flu vaccine:  Completed in October, 2020.    reports that she has never smoked. She has never used smokeless tobacco. She reports current alcohol use of about 2.0 standard drinks of alcohol per week. She reports that she does not use drugs.  Past Medical History:  Diagnosis Date  . GERD (gastroesophageal reflux disease)   . Glaucoma   . H/O cervical polypectomy   . History of kidney stones   . Hyperlipidemia   . Kidney stone     Past Surgical History:  Procedure Laterality Date  . NO PAST SURGERIES    . ORIF ULNAR FRACTURE Left 10/21/2012   Procedure: OPEN REDUCTION INTERNAL FIXATION left elbow medial epicondyle and ulnar nerve release ;  Surgeon: Dominica Severin, MD;  Location: [facility];  Service: Orthopedics;  Laterality: Left;  OPEN REDUCTION INTERNAL FIXATION left elbow medial epicondyle and ulnar nerve release     Current Outpatient Medications  Medication Sig Dispense Refill  . simvastatin (ZOCOR) 10 MG tablet Take 1  tablet by mouth daily.     No current facility-administered medications for this visit.    Family History  Problem Relation Age of Onset  . Hypertension Father   . Stroke Father   . Ovarian cancer Maternal Grandmother 60       dec ovarian ca  . Diabetes Paternal Grandfather     Review of Systems  Genitourinary:       Urinary incontinence  All other systems reviewed and are negative.   Exam:   BP 120/64   Pulse 72   Temp (!) 97.1 F (36.2 C) (Temporal)   Resp 20   Ht 5\' 4"  (1.626 m)   Wt 147 lb 9.6 oz (67 kg)   LMP 10/01/2006 (Exact Date)   BMI 25.34 kg/m     General appearance: alert, cooperative and appears stated age Head: normocephalic, without obvious abnormality, atraumatic Neck: no adenopathy, supple, symmetrical, trachea midline and thyroid normal to inspection and palpation Lungs: clear to auscultation bilaterally Breasts: normal appearance, no masses or tenderness, No nipple retraction or dimpling, No nipple discharge or bleeding, No axillary adenopathy Heart: regular rate and rhythm Abdomen: soft, non-tender; no masses, no organomegaly Extremities: extremities normal, atraumatic, no cyanosis or edema Skin: skin color, texture, turgor normal. No rashes or lesions Lymph nodes: cervical, supraclavicular, and axillary nodes normal. Neurologic: grossly normal  Pelvic: External genitalia:  no lesions              No abnormal inguinal nodes palpated.  Urethra:  normal appearing urethra with no masses, tenderness or lesions              Bartholins and Skenes: normal                 Vagina: normal appearing vagina with normal color and discharge, no lesions              Cervix: no lesions              Pap taken: No. Bimanual Exam:  Uterus:  normal size, contour, position, consistency, mobility, non-tender              Adnexa: no mass, fullness, tenderness              Rectal exam: Yes.  .  Confirms.              Anus:  normal sphincter tone, no  lesions  Chaperone was present for exam.  Assessment:   Well woman visit with normal exam. Mixed incontinence.   Plan: Mammogram screening discussed. Self breast awareness reviewed. Pap and HR HPV as above. Guidelines for Calcium, Vitamin D, regular exercise program including cardiovascular and weight bearing exercise. Urodynamic testing.  ACOG HO on incontinence and surgery for urinary incontinence. We discussed potential midurethral sling with permanent mesh material. Follow up annually and prn.   After visit summary provided.

## 2019-10-16 ENCOUNTER — Telehealth: Payer: Self-pay | Admitting: Obstetrics and Gynecology

## 2019-10-16 NOTE — Telephone Encounter (Signed)
Spoke with patient regarding urodynamic benefits. Patient acknowledges understanding of information presented. Patient is aware of cancellation policy. Patient has additional clinical questions, but is ready to proceed with scheduling.

## 2019-10-17 NOTE — Telephone Encounter (Signed)
Spoke with patient. Patient would like to know if she proceeds with urodynamics right now if she can wait for surgery. Advised urodynamics needs to be completed within 6 months of surgery or we will have to repeat the study to ensure more accurate results. Patient is agreeable. Patient states that she does not wish to proceed with surgery at this time due to COVID and will call back when she is ready to proceed.  Cc: Hayley Carder  Routing to provider and will close encounter.

## 2020-02-27 ENCOUNTER — Telehealth: Payer: Self-pay

## 2020-02-27 NOTE — Telephone Encounter (Signed)
AEX 09/2019 No FH of breast cancer  Spoke with pt. Pt states having left breast pain near arm pit x 1.5 months. Pt states thinking she pulled muscle when she helped her daughter move boxes at the beginning of May. Pt states has gotten better, but not gone completely.  Pt denies breast lumps, breast skin changes or size, redness, nipple discharge, fever or chills. Pt states had Covid vaccine in right arm.  Last MMG 03/2019 with Birads 0, pt states had follow up right breast US 10 days after screening MMG and was negative. See Care Everywhere, Birads 2, Benign.   Advised pt to have OV for further evaluation. Pt agreeable. Pt scheduled with Dr Edward Jolly on 6/24 at 945 am as work-in appt , ok per Yvonna Alanis, RN. Will return call to pt with any additional recommendations. Pt agreeable.   Routing to Dr Edward Jolly for review.  Encounter closed.

## 2020-02-27 NOTE — Progress Notes (Signed)
GYNECOLOGY  VISIT   HPI: 59 y.o.   Married  [place]  female   [phone] with Patient's last menstrual period was 10/01/2006 (exact date).   here for left breast pain since first of May.  This began as she was helping her daughter move. It is a constant pain that moves up and down on the left lateral breast. May be better this week.  Movement does not make the pain worse.  No pain medication use.  No palpable lump.   Does not take hormonal medication.   Received her Covid vaccine - 12/08/19 - second dose.  Each time on the right arm.   No chest pain with activity.  No SOB.  GYNECOLOGIC HISTORY: Patient's last menstrual period was 10/01/2006 (exact date). Contraception: PMP Menopausal hormone therapy: none Last mammogram: 03-22-19 Rt.Br.asymmetry;Lt.Br.Neg/density D--Rt.Diag.w/Rt.U/S--neg/BiRads2/screening 1 year/WF Last pap smear:  08-04-16 Neg:Neg HR HPV, 2015 normal per patient        OB History    Gravida  3   Para  3   Term  3   Preterm      AB      Living        SAB      TAB      Ectopic      Multiple      Live Births  3              There are no problems to display for this patient.   Past Medical History:  Diagnosis Date  . GERD (gastroesophageal reflux disease)   . Glaucoma   . H/O cervical polypectomy   . History of kidney stones   . Hyperlipidemia   . Kidney stone     Past Surgical History:  Procedure Laterality Date  . NO PAST SURGERIES    . ORIF ULNAR FRACTURE Left 10/21/2012   Procedure: OPEN REDUCTION INTERNAL FIXATION left elbow medial epicondyle and ulnar nerve release ;  Surgeon: Roseanne Kaufman, MD;  Location: [facility];  Service: Orthopedics;  Laterality: Left;  OPEN REDUCTION INTERNAL FIXATION left elbow medial epicondyle and ulnar nerve release     Current Outpatient Medications  Medication Sig Dispense Refill  . simvastatin (ZOCOR) 10 MG tablet Take 1 tablet by mouth daily.     No current facility-administered medications for  this visit.     ALLERGIES: Patient has no known allergies.  Family History  Problem Relation Age of Onset  . Hypertension Father   . Stroke Father   . Ovarian cancer Maternal Grandmother 55       dec ovarian ca  . Diabetes Paternal Grandfather     Social History   Socioeconomic History  . Marital status: Married    Spouse name: Not on file  . Number of children: Not on file  . Years of education: Not on file  . Highest education level: Not on file  Occupational History  . Not on file  Tobacco Use  . Smoking status: Never Smoker  . Smokeless tobacco: Never Used  Vaping Use  . Vaping Use: Never used  Substance and Sexual Activity  . Alcohol use: Yes    Alcohol/week: 2.0 standard drinks    Types: 2 Glasses of wine per week    Comment: occ  . Drug use: No  . Sexual activity: Yes    Partners: Male    Birth control/protection: Post-menopausal  Other Topics Concern  . Not on file  Social History Narrative  . Not on file  Social Determinants of Health   Financial Resource Strain:   . Difficulty of Paying Living Expenses:   Food Insecurity:   . Worried About Programme researcher, broadcasting/film/video in the Last Year:   . Barista in the Last Year:   Transportation Needs:   . Freight forwarder (Medical):   Marland Kitchen Lack of Transportation (Non-Medical):   Physical Activity:   . Days of Exercise per Week:   . Minutes of Exercise per Session:   Stress:   . Feeling of Stress :   Social Connections:   . Frequency of Communication with Friends and Family:   . Frequency of Social Gatherings with Friends and Family:   . Attends Religious Services:   . Active Member of Clubs or Organizations:   . Attends Banker Meetings:   Marland Kitchen Marital Status:   Intimate Partner Violence:   . Fear of Current or Ex-Partner:   . Emotionally Abused:   Marland Kitchen Physically Abused:   . Sexually Abused:     Review of Systems  All other systems reviewed and are negative.   PHYSICAL EXAMINATION:     BP 122/76   Pulse 60   Temp (!) 97.3 F (36.3 C) (Temporal)   Ht 5\' 4"  (1.626 m)   Wt 148 lb 12.8 oz (67.5 kg)   LMP 10/01/2006 (Exact Date)   BMI 25.54 kg/m     General appearance: alert, cooperative and appears stated age   Breasts: right - normal appearance, no masses or tenderness, No nipple retraction or dimpling, No nipple discharge or bleeding, No axillary adenopathy Left - normal appearance, 3 cm mass at 2:00 with mild tenderness, No nipple retraction or dimpling, No nipple discharge or bleeding, No axillary adenopathy  Chaperone was present for exam.  ASSESSMENT  Left breast mass and pain.  PLAN  We discussed breast masses.  Will proceed with dx bilateral mammogram and 10/03/2006 at [place] in [facility].  If her imagining is normal, she will need a breast recheck in 6 weeks.  Fu for annual exam and prn.

## 2020-02-27 NOTE — Telephone Encounter (Signed)
Patient is calling in regards to pain in left breast.  

## 2020-02-28 ENCOUNTER — Other Ambulatory Visit: Payer: Self-pay

## 2020-02-28 ENCOUNTER — Encounter: Payer: Self-pay | Admitting: Obstetrics and Gynecology

## 2020-02-28 ENCOUNTER — Telehealth: Payer: Self-pay | Admitting: Obstetrics and Gynecology

## 2020-02-28 ENCOUNTER — Ambulatory Visit: Payer: BC Managed Care – PPO | Admitting: Obstetrics and Gynecology

## 2020-02-28 VITALS — BP 122/76 | HR 60 | Temp 97.3°F | Ht 64.0 in | Wt 148.8 lb

## 2020-02-28 DIAGNOSIS — N644 Mastodynia: Secondary | ICD-10-CM | POA: Diagnosis not present

## 2020-02-28 DIAGNOSIS — N6320 Unspecified lump in the left breast, unspecified quadrant: Secondary | ICD-10-CM

## 2020-02-28 NOTE — Telephone Encounter (Signed)
Please schedule a bilateral dx mammogram and left breast US for the patient at [facility] in [facility].   She has left breast pain and a 3 cm mass at 2:00 of the left breast.   Her routine mammogram is due in July, 2021.

## 2020-02-28 NOTE — Telephone Encounter (Signed)
Spoke with Pam at [facility].  Patient scheduled for bilateral Dx MMG and left breast US on 03/05/20 at 3pm, arrive at 2:45pm.    Spoke with patient, advised of appt date and time as seen above. Patient is agreeable to date and time.   Routing to provider for final review. Patient is agreeable to disposition. Will close encounter.

## 2020-02-28 NOTE — Telephone Encounter (Signed)
Written order to Dr. Edward Jolly for signature.  Placed in MMG hold.

## 2020-03-18 ENCOUNTER — Telehealth: Payer: Self-pay | Admitting: Obstetrics and Gynecology

## 2020-03-18 NOTE — Telephone Encounter (Signed)
Please contact patient in follow up to her mammogram and ultrasound.   She was seen for a left breast lump on 02/28/20.   Her dx mammogram and ultrasound showed no evidence of malignancy.   Please schedule a breast recheck with me for the middle of August.   Continue in mammogram hold a this time.

## 2020-03-19 NOTE — Telephone Encounter (Signed)
Spoke with patient, advised per Dr. Edward Jolly.  OV scheduled for 04/23/20 at 4:30pm. Patient verbalizes understanding and is agreeable.   Copy of report to scan. Report in Care Everywhere.   Continue MMG hold.   Routing to provider for final review. Patient is agreeable to disposition. Will close encounter.

## 2020-04-02 ENCOUNTER — Encounter: Payer: Self-pay | Admitting: Obstetrics and Gynecology

## 2020-04-23 ENCOUNTER — Encounter: Payer: Self-pay | Admitting: Obstetrics and Gynecology

## 2020-04-23 ENCOUNTER — Telehealth: Payer: Self-pay | Admitting: Obstetrics and Gynecology

## 2020-04-23 ENCOUNTER — Ambulatory Visit: Payer: BC Managed Care – PPO | Admitting: Obstetrics and Gynecology

## 2020-04-23 ENCOUNTER — Other Ambulatory Visit: Payer: Self-pay

## 2020-04-23 VITALS — BP 108/70 | HR 60 | Resp 12 | Ht 62.0 in | Wt 149.5 lb

## 2020-04-23 DIAGNOSIS — N6320 Unspecified lump in the left breast, unspecified quadrant: Secondary | ICD-10-CM | POA: Diagnosis not present

## 2020-04-23 NOTE — Progress Notes (Signed)
GYNECOLOGY  VISIT   HPI: 59 y.o.   Married  [place]  female   [phone] with Patient's last menstrual period was 10/01/2006 (exact date).   here for breast recheck. Patient states breast pain is gone.   Left breast pain and a mss noted on 02/28/20. Mass felt 3 cm in size.  Normal dx mammogram and [place].  Thinks the pain was from carrying boxes.  Not taking hormonal treatment.   GYNECOLOGIC HISTORY: Patient's last menstrual period was 10/01/2006 (exact date). Contraception: PMP Menopausal hormone therapy:  none Last mammogram: 03-05-20 Diag.Bil.w/Lt.Br.U/S--Neg/density D/screening 59yr Last pap smear: 08-04-16 Neg:Neg HR HPV, 2015 normal per patient        OB History    Gravida  3   Para  3   Term  3   Preterm      AB      Living        SAB      TAB      Ectopic      Multiple      Live Births  3              There are no problems to display for this patient.   Past Medical History:  Diagnosis Date   GERD (gastroesophageal reflux disease)    Glaucoma    H/O cervical polypectomy    History of kidney stones    Hyperlipidemia    Kidney stone     Past Surgical History:  Procedure Laterality Date   NO PAST SURGERIES     ORIF ULNAR FRACTURE Left 10/21/2012   Procedure: OPEN REDUCTION INTERNAL FIXATION left elbow medial epicondyle and ulnar nerve release ;  Surgeon: Dominica Severin, MD;  Location: [facility];  Service: Orthopedics;  Laterality: Left;  OPEN REDUCTION INTERNAL FIXATION left elbow medial epicondyle and ulnar nerve release     Current Outpatient Medications  Medication Sig Dispense Refill   simvastatin (ZOCOR) 10 MG tablet Take 1 tablet by mouth daily.     No current facility-administered medications for this visit.     ALLERGIES: Patient has no known allergies.  Family History  Problem Relation Age of Onset   Hypertension Father    Stroke Father    Ovarian cancer Maternal Grandmother 59       dec ovarian ca   Diabetes Paternal  Grandfather     Social History   Socioeconomic History   Marital status: Married    Spouse name: Not on file   Number of children: Not on file   Years of education: Not on file   Highest education level: Not on file  Occupational History   Not on file  Tobacco Use   Smoking status: Never Smoker   Smokeless tobacco: Never Used  Vaping Use   Vaping Use: Never used  Substance and Sexual Activity   Alcohol use: Yes    Alcohol/week: 2.0 standard drinks    Types: 2 Glasses of wine per week    Comment: occ   Drug use: No   Sexual activity: Yes    Partners: Male    Birth control/protection: Post-menopausal  Other Topics Concern   Not on file  Social History Narrative   Not on file   Social Determinants of Health   Financial Resource Strain:    Difficulty of Paying Living Expenses:   Food Insecurity:    Worried About Radiation protection practitioner of Food in the Last Year:    Ran Out of Food in the Last  Year:   Transportation Needs:    Freight forwarder (Medical):    Lack of Transportation (Non-Medical):   Physical Activity:    Days of Exercise per Week:    Minutes of Exercise per Session:   Stress:    Feeling of Stress :   Social Connections:    Frequency of Communication with Friends and Family:    Frequency of Social Gatherings with Friends and Family:    Attends Religious Services:    Active Member of Clubs or Organizations:    Attends Engineer, structural:    Marital Status:   Intimate Partner Violence:    Fear of Current or Ex-Partner:    Emotionally Abused:    Physically Abused:    Sexually Abused:     Review of Systems  All other systems reviewed and are negative.   PHYSICAL EXAMINATION:    BP 108/70 (BP Location: Right Arm, Patient Position: Sitting, Cuff Size: Normal)    Pulse 60    Resp 12    Ht 5\' 2"  (1.575 m)    Wt 149 lb 8 oz (67.8 kg)    LMP 10/01/2006 (Exact Date)    BMI 27.34 kg/m     General appearance: alert,  cooperative and appears stated age  Breasts: right - normal appearance, no masses or tenderness, No nipple retraction or dimpling, No nipple discharge or bleeding, No axillary or supraclavicular adenopathy.  Left - 3 cm fullness left upper outer quadrant.  No nipple retraction or dimpling, No nipple discharge or bleeding, No axillary or supraclavicular adenopathy.  Chaperone was present for exam.  ASSESSMENT  Left breast mass.  Normal dx mammogram and 10/03/2006.  Density D on mammogram.   PLAN  Will refer to general surgery for further evaluation and treatment.  Possible breast MRI.  Fu prn.

## 2020-04-23 NOTE — Telephone Encounter (Signed)
Please place patient in mammogram hold.   I have place a referral to Dr. Emelia Loron for evaluation of a persistent left breast mass with normal diagnostic imaging.  She has category D density on the mammogram.

## 2020-04-24 NOTE — Telephone Encounter (Signed)
Continue MMG hold.   Encounter closed.  

## 2020-06-11 ENCOUNTER — Other Ambulatory Visit: Payer: Self-pay | Admitting: General Surgery

## 2020-06-11 DIAGNOSIS — R922 Inconclusive mammogram: Secondary | ICD-10-CM

## 2020-07-01 ENCOUNTER — Other Ambulatory Visit: Payer: Self-pay

## 2020-07-01 ENCOUNTER — Ambulatory Visit
Admission: RE | Admit: 2020-07-01 | Discharge: 2020-07-01 | Disposition: A | Discharge status: Home / Self Care | Payer: No Typology Code available for payment source | Source: Ambulatory Visit | Attending: General Surgery | Admitting: General Surgery

## 2020-07-01 DIAGNOSIS — R922 Inconclusive mammogram: Secondary | ICD-10-CM

## 2020-07-01 MED ORDER — GADOBUTROL 1 MMOL/ML IV SOLN
7.0000 mL | Freq: Once | INTRAVENOUS | Status: AC | PRN
  Administered 2020-07-01: 7 mL via INTRAVENOUS

## 2020-10-01 ENCOUNTER — Ambulatory Visit: Payer: BC Managed Care – PPO | Admitting: Obstetrics and Gynecology

## 2020-12-31 NOTE — Progress Notes (Signed)
60 y.o. G1P3000 Married Caucasian/[place]ian female here for annual exam.    Saw DR. Dwain Sarna last year due to left breast mass.  Had a normal breast MRI.  No biopsy done.   Vaginal dryness treated with lubricant.  Has some skin tags in the vulvar area.   Still with urinary incontinence.  Wants to do Kegel's and observation.   Returned to working out.   Taking daily calcium, vit D, and glucosamine.   Has completed all 2 Covid vaccines and 2 boosters.  Going to [place] next week.   PCP: Bernadette Hoit, MD  Patient's last menstrual period was 10/01/2006 (exact date).           Sexually active: Yes.    The current method of family planning is post menopausal status.    Exercising: Yes.    work out classes 3x/week Smoker:  no   Health Maintenance: Pap: 08-04-16 Neg:Neg HR HPV, 2015 normal per patient. History of abnormal Pap:  no MMG: 07-01-20 Bil.Abbreviated Br.MRI/Neg/BiRads1/screening 9yr. Colonoscopy: 01/2017 normal in [place];next due 01/2022. BMD:  n/a  Result  n/a TDaP:  08-04-19 Gardasil:   no HIV:no Hep C: 2017 Neg per pt.with PCP Screening Labs:  PCP.   reports that she has never smoked. She has never used smokeless tobacco. She reports current alcohol use of about 2.0 standard drinks of alcohol per week. She reports that she does not use drugs.  Past Medical History:  Diagnosis Date  . GERD (gastroesophageal reflux disease)   . Glaucoma   . H/O cervical polypectomy   . History of kidney stones   . Hyperlipidemia   . Kidney stone     Past Surgical History:  Procedure Laterality Date  . NO PAST SURGERIES    . ORIF ULNAR FRACTURE Left 10/21/2012   Procedure: OPEN REDUCTION INTERNAL FIXATION left elbow medial epicondyle and ulnar nerve release ;  Surgeon: Dominica Severin, MD;  Location: [facility];  Service: Orthopedics;  Laterality: Left;  OPEN REDUCTION INTERNAL FIXATION left elbow medial epicondyle and ulnar nerve release     Current Outpatient  Medications  Medication Sig Dispense Refill  . simvastatin (ZOCOR) 10 MG tablet Take 1 tablet by mouth daily.    . Vitamin D, Ergocalciferol, 50 MCG (2000 UT) CAPS Take 1 capsule by mouth at bedtime.     No current facility-administered medications for this visit.    Family History  Problem Relation Age of Onset  . Hypertension Father   . Stroke Father   . Ovarian cancer Maternal Grandmother 80       dec ovarian ca  . Diabetes Paternal Grandfather     Review of Systems  All other systems reviewed and are negative.   Exam:   BP 110/80   Pulse 83   Ht 5\' 4"  (1.626 m)   Wt 142 lb (64.4 kg)   LMP 10/01/2006 (Exact Date)   SpO2 98%   BMI 24.37 kg/m     General appearance: alert, cooperative and appears stated age Head: normocephalic, without obvious abnormality, atraumatic Neck: no adenopathy, supple, symmetrical, trachea midline and thyroid normal to inspection and palpation Lungs: clear to auscultation bilaterally Breasts: normal appearance, no masses or tenderness, No nipple retraction or dimpling, No nipple discharge or bleeding, No axillary adenopathy Heart: regular rate and rhythm Abdomen: soft, non-tender; no masses, no organomegaly Extremities: extremities normal, atraumatic, no cyanosis or edema Skin: skin color, texture, turgor normal. No rashes or lesions Lymph nodes: cervical, supraclavicular, and axillary nodes normal.  Neurologic: grossly normal  Pelvic: External genitalia:  no lesions.  2 mm skin tag of right groin and 3 -4 mm nevus of right vulva.               No abnormal inguinal nodes palpated.              Urethra:  normal appearing urethra with no masses, tenderness or lesions              Bartholins and Skenes: normal                 Vagina: normal appearing vagina with normal color and discharge, no lesions              Cervix: no lesions.  2 -3 mm sessile polyp.  Unable to reach to remove.              Pap taken: No. Bimanual Exam:  Uterus:  normal  size, contour, position, consistency, mobility, non-tender              Adnexa: no mass, fullness, tenderness              Rectal exam: Yes.  .  Confirms.              Anus:  normal sphincter tone, no lesions  Chaperone was present for exam.  Assessment:   Well woman visit with normal exam. Mixed incontinence.  Hx dense breasts.    Plan: Mammogram screening discussed. Breast MRI abbreviated every 2 -3 years as desired. Self breast awareness reviewed. Pap and HR HPV next year.  Guidelines for Calcium, Vitamin D, regular exercise program including cardiovascular and weight bearing exercise. Kegel's. Follow up annually and prn.

## 2021-01-01 ENCOUNTER — Ambulatory Visit (INDEPENDENT_AMBULATORY_CARE_PROVIDER_SITE_OTHER): Payer: BC Managed Care – PPO | Admitting: Obstetrics and Gynecology

## 2021-01-01 ENCOUNTER — Other Ambulatory Visit: Payer: Self-pay

## 2021-01-01 ENCOUNTER — Encounter: Payer: Self-pay | Admitting: Obstetrics and Gynecology

## 2021-01-01 VITALS — BP 110/80 | HR 83 | Ht 64.0 in | Wt 142.0 lb

## 2021-01-01 DIAGNOSIS — Z01419 Encounter for gynecological examination (general) (routine) without abnormal findings: Secondary | ICD-10-CM | POA: Diagnosis not present

## 2021-01-01 NOTE — Patient Instructions (Signed)

## 2022-01-04 NOTE — Progress Notes (Signed)
61 y.o. G72P3000 Married Hispanic female here for annual exam.   ? ?Still having bladder issues.  ?Some urgency at the end of her walks if she drinks water first.  ?No leak with exercise or sneezing.  ? ?PCP:  Bernadette Hoit, MD   ? ?Patient's last menstrual period was 10/01/2006 (exact date).     ?  ?    ?Sexually active: Yes.    ?The current method of family planning is post menopausal status.    ?Exercising: Yes.    GYM, health club. ?Smoker:  no ? ?Health Maintenance: ?Pap:  08-04-16 Normal neg HR HPV ?History of abnormal Pap:  no ?MMG:03-19-21 normal Cat C BiRADS 1...07-01-20 Bilat MRI normal BiRADS 1 Cat.C ?Colonoscopy:  2018 normal.  She is in the process of scheduling  this.  ?BMD:   Never  Result   ?TDaP:  2020 ?Gardasil:   no ?HIV:No  ?Hep C:Neg ?Screening Labs:  Hb today: PCP, Urine today: PCP ? ? reports that she has never smoked. She has never used smokeless tobacco. She reports current alcohol use of about 2.0 standard drinks per week. She reports that she does not use drugs. ? ?Past Medical History:  ?Diagnosis Date  ? GERD (gastroesophageal reflux disease)   ? Glaucoma   ? H/O cervical polypectomy   ? History of kidney stones   ? Hyperlipidemia   ? Kidney stone   ? ? ?Past Surgical History:  ?Procedure Laterality Date  ? NO PAST SURGERIES    ? ORIF ULNAR FRACTURE Left 10/21/2012  ? Procedure: OPEN REDUCTION INTERNAL FIXATION left elbow medial epicondyle and ulnar nerve release ;  Surgeon: Dominica Severin, MD;  Location: [facility] OR;  Service: Orthopedics;  Laterality: Left;  OPEN REDUCTION INTERNAL FIXATION left elbow medial epicondyle and ulnar nerve release   ? ? ?Current Outpatient Medications  ?Medication Sig Dispense Refill  ? Calcium Carbonate-Vit D-Min (CALCIUM 1200 PO) Take by mouth.    ? simvastatin (ZOCOR) 10 MG tablet Take 1 tablet by mouth daily.    ? Vitamin D, Ergocalciferol, 50 MCG (2000 UT) CAPS Take 1 capsule by mouth at bedtime.    ? ?No current facility-administered medications for this  visit.  ? ? ?Family History  ?Problem Relation Age of Onset  ? Hypertension Father   ? Stroke Father   ? Ovarian cancer Maternal Grandmother 73  ?     dec ovarian ca  ? Diabetes Paternal Grandfather   ? ? ?Review of Systems  ?All other systems reviewed and are negative. ? ?Exam:   ?BP 122/80 (BP Location: Left Arm, Patient Position: Sitting, Cuff Size: Normal)   Pulse 72   Ht 5\' 4"  (1.626 m)   Wt 148 lb (67.1 kg)   LMP 10/01/2006 (Exact Date)   SpO2 98%   BMI 25.40 kg/m?     ?General appearance: alert, cooperative and appears stated age ?Head: normocephalic, without obvious abnormality, atraumatic ?Neck: no adenopathy, supple, symmetrical, trachea midline and thyroid normal to inspection and palpation ?Lungs: clear to auscultation bilaterally ?Breasts: normal appearance, no masses or tenderness, No nipple retraction or dimpling, No nipple discharge or bleeding, No axillary adenopathy ?Heart: regular rate and rhythm ?Abdomen: soft, non-tender; no masses, no organomegaly ?Extremities: extremities normal, atraumatic, no cyanosis or edema ?Skin: skin color, texture, turgor normal. No rashes or lesions ?Lymph nodes: cervical, supraclavicular, and axillary nodes normal. ?Neurologic: grossly normal ? ?Pelvic: External genitalia:  no lesions ?  No abnormal inguinal nodes palpated. ?             Urethra:  normal appearing urethra with no masses, tenderness or lesions ?             Bartholins and Skenes: normal    ?             Vagina: normal appearing vagina with normal color and discharge, no lesions ?             Cervix: no lesions ?             Pap taken: yes ?Bimanual Exam:  Uterus:  normal size, contour, position, consistency, mobility, non-tender ?             Adnexa: no mass, fullness, tenderness ?             Rectal exam: yes.  Confirms. ?             Anus:  normal sphincter tone, no lesions ? ?Chaperone was present for exam: Joy, CMA ? ?Assessment:   ?Well woman visit with gynecologic  exam. ?Cervical cancer screening. ?Overactive bladder. ?Dense breasts. ? ?Plan: ?Mammogram screening discussed. ?Self breast awareness reviewed. ?Pap and HR HPV as above. ?Guidelines for Calcium, Vitamin D, regular exercise program including cardiovascular and weight bearing exercise. ?We discussed potential medication for overactive bladder, and she declines this. ?Follow up annually and prn.  ? ?After visit summary provided.  ? ? ?  ?

## 2022-01-06 ENCOUNTER — Ambulatory Visit: Payer: BC Managed Care – PPO | Admitting: Obstetrics and Gynecology

## 2022-01-15 ENCOUNTER — Other Ambulatory Visit (HOSPITAL_COMMUNITY)
Admission: RE | Admit: 2022-01-15 | Discharge: 2022-01-15 | Disposition: A | Discharge status: Home / Self Care | Payer: BC Managed Care – PPO | Source: Ambulatory Visit | Attending: Obstetrics and Gynecology | Admitting: Obstetrics and Gynecology

## 2022-01-15 ENCOUNTER — Encounter: Payer: Self-pay | Admitting: Obstetrics and Gynecology

## 2022-01-15 ENCOUNTER — Ambulatory Visit (INDEPENDENT_AMBULATORY_CARE_PROVIDER_SITE_OTHER): Payer: BC Managed Care – PPO | Admitting: Obstetrics and Gynecology

## 2022-01-15 VITALS — BP 122/80 | HR 72 | Ht 64.0 in | Wt 148.0 lb

## 2022-01-15 DIAGNOSIS — Z124 Encounter for screening for malignant neoplasm of cervix: Secondary | ICD-10-CM | POA: Insufficient documentation

## 2022-01-15 DIAGNOSIS — Z01419 Encounter for gynecological examination (general) (routine) without abnormal findings: Secondary | ICD-10-CM

## 2022-01-15 NOTE — Patient Instructions (Signed)

## 2022-01-18 LAB — CYTOLOGY - PAP
Comment: NEGATIVE
Diagnosis: NEGATIVE
High risk HPV: NEGATIVE

## 2023-02-22 ENCOUNTER — Ambulatory Visit: Payer: BC Managed Care – PPO | Admitting: Obstetrics and Gynecology

## 2023-03-08 ENCOUNTER — Ambulatory Visit: Payer: BC Managed Care – PPO | Admitting: Obstetrics and Gynecology

## 2023-06-14 NOTE — Progress Notes (Signed)
62 y.o. G51P3003 Married Caucasian female here for annual exam.    She has no GYN concerns.   Has prediabetes and osteoporosis of spine, and will follow up with her PCP.  PCP: Bernadette Hoit, MD   Patient's last menstrual period was 10/01/2006 (exact date).           Sexually active: Yes.    The current method of family planning is post menopausal status.    Exercising: Yes.     Workout classes Smoker:  no  OB History  Gravida Para Term Preterm AB Living  3 3 3     3   SAB IAB Ectopic Multiple Live Births          3    # Outcome Date GA Lbr Len/2nd Weight Sex Type Anes PTL Lv  3 Term      Vag-Spont     2 Term      Vag-Spont     1 Term      Vag-Spont        Health Maintenance: Pap:  01/15/22 neg: HR HPV neg,08-04-16 Normal neg HR HPV  History of abnormal Pap:  no MMG: 03/23/23 - BI-RADS1 - [facility] Colonoscopy:  did in 2023 - [place] - polyp - due in 2028 per patient.   BMD:  12/01/22  Result  osteoporosis of spine HIV: n/a Hep C: n/a  Immunization History  Administered Date(s) Administered   Influenza,inj,Quad PF,6+ Mos 07/28/2016   Influenza,inj,Quad PF,6-35 Mos 06/24/2017, 08/23/2018, 05/29/2019, 08/14/2020   Influenza,inj,quad, With Preservative 06/05/2015   Moderna Sars-Covid-2 Vaccination 07/19/2020   Tdap 07/02/2010, 07/25/2019    Took Shingles, flu, and Covid.    reports that she has never smoked. She has never used smokeless tobacco. She reports current alcohol use of about 2.0 standard drinks of alcohol per week. She reports that she does not use drugs.  Past Medical History:  Diagnosis Date   Elevated hemoglobin A1c    GERD (gastroesophageal reflux disease)    Glaucoma    H/O cervical polypectomy    History of kidney stones    Hyperlipidemia    Kidney stone     Past Surgical History:  Procedure Laterality Date   NO PAST SURGERIES     ORIF ULNAR FRACTURE Left 10/21/2012   Procedure: OPEN REDUCTION INTERNAL FIXATION left elbow medial epicondyle  and ulnar nerve release ;  Surgeon: Dominica Severin, MD;  Location: [facility] OR;  Service: Orthopedics;  Laterality: Left;  OPEN REDUCTION INTERNAL FIXATION left elbow medial epicondyle and ulnar nerve release     Current Outpatient Medications  Medication Sig Dispense Refill   Calcium Carbonate-Vit D-Min (CALCIUM 1200 PO) Take by mouth.     Omega-3 1000 MG CAPS Take by mouth.     simvastatin (ZOCOR) 10 MG tablet Take 1 tablet by mouth daily.     Vitamin D, Ergocalciferol, 50 MCG (2000 UT) CAPS Take 1 capsule by mouth at bedtime.     No current facility-administered medications for this visit.    Family History  Problem Relation Age of Onset   Hypertension Father    Stroke Father    Ovarian cancer Maternal Grandmother 61       dec ovarian ca   Diabetes Paternal Grandfather     Review of Systems  All other systems reviewed and are negative.   Exam:   BP 128/84 (BP Location: Left Arm, Patient Position: Sitting, Cuff Size: Normal)   Pulse 65   Ht 5\' 5"  (1.651  m)   Wt 144 lb (65.3 kg)   LMP 10/01/2006 (Exact Date)   SpO2 99%   BMI 23.96 kg/m     General appearance: alert, cooperative and appears stated age Head: normocephalic, without obvious abnormality, atraumatic Neck: no adenopathy, supple, symmetrical, trachea midline and thyroid normal to inspection and palpation Lungs: clear to auscultation bilaterally Breasts: normal appearance, no masses or tenderness, No nipple retraction or dimpling, No nipple discharge or bleeding, No axillary adenopathy Heart: regular rate and rhythm Abdomen: soft, non-tender; no masses, no organomegaly Extremities: extremities normal, atraumatic, no cyanosis or edema Skin: skin color, texture, turgor normal. No rashes or lesions Lymph nodes: cervical, supraclavicular, and axillary nodes normal. Neurologic: grossly normal  Pelvic: External genitalia:  no lesions              No abnormal inguinal nodes palpated.              Urethra:  normal  appearing urethra with no masses, tenderness or lesions              Bartholins and Skenes: normal                 Vagina: normal appearing vagina with normal color and discharge, no lesions              Cervix: no lesions              Pap taken: no Bimanual Exam:  Uterus:  normal size, contour, position, consistency, mobility, non-tender              Adnexa: no mass, fullness, tenderness              Rectal exam: yes.  Confirms.              Anus:  normal sphincter tone, no lesions  Chaperone was present for exam:  Warren Lacy, CMA   Assessment and Plan:   1. Well woman exam with routine gynecological exam Mammogram screening discussed. Self breast awareness reviewed. Pap and HR HPV in 2028.  Follow up yearly and prn.   2. Age-related osteoporosis without current pathological fracture We briefly discussed osteoporosis, increased risk of fracture, and possible treatment options with Fosamax or Prolia.   Written information given.  Guidelines for Calcium, Vitamin D, regular exercise program including cardiovascular and weight bearing exercise. She will follow up with her PCP.

## 2023-06-28 ENCOUNTER — Encounter: Payer: Self-pay | Admitting: Obstetrics and Gynecology

## 2023-06-28 ENCOUNTER — Ambulatory Visit (INDEPENDENT_AMBULATORY_CARE_PROVIDER_SITE_OTHER): Payer: 59 | Admitting: Obstetrics and Gynecology

## 2023-06-28 VITALS — BP 128/84 | HR 65 | Ht 65.0 in | Wt 144.0 lb

## 2023-06-28 DIAGNOSIS — Z01419 Encounter for gynecological examination (general) (routine) without abnormal findings: Secondary | ICD-10-CM | POA: Diagnosis not present

## 2023-06-28 DIAGNOSIS — M810 Age-related osteoporosis without current pathological fracture: Secondary | ICD-10-CM | POA: Diagnosis not present

## 2023-06-28 NOTE — Patient Instructions (Addendum)
Denosumab Injection (Osteoporosis) What is this medication? DENOSUMAB (den oh SUE mab) prevents and treats osteoporosis. It works by Interior and spatial [employer] stronger and less likely to break (fracture). It is a monoclonal antibody. This medicine may be used for other purposes; ask your health care provider or pharmacist if you have questions. COMMON BRAND NAME(S): Prolia What should I tell my care team before I take this medication? They need to know if you have any of these conditions: Dental or gum disease Had thyroid or parathyroid (glands located in neck) surgery Having dental surgery or a tooth pulled Kidney disease Low levels of calcium in the blood On dialysis Poor nutrition Thyroid disease Trouble absorbing nutrients from your food An unusual or allergic reaction to denosumab, other medications, foods, dyes, or preservatives Pregnant or trying to get pregnant Breastfeeding How should I use this medication? This medication is injected under the skin. It is given by your care team in a hospital or clinic setting. A special MedGuide will be given to you before each treatment. Be sure to read this information carefully each time. Talk to your care team about the use of this medication in children. Special care may be needed. Overdosage: If you think you have taken too much of this medicine contact a poison control center or emergency room at once. NOTE: This medicine is only for you. Do not share this medicine with others. What if I miss a dose? Keep appointments for follow-up doses. It is important not to miss your dose. Call your care team if you are unable to keep an appointment. What may interact with this medication? Do not take this medication with any of the following: Other medications that contain denosumab This medication may also interact with the following: Medications that lower your chance of fighting infection Steroid medications, such as prednisone or cortisone This  list may not describe all possible interactions. Give your health care provider a list of all the medicines, herbs, non-prescription drugs, or dietary supplements you use. Also tell them if you smoke, drink alcohol, or use illegal drugs. Some items may interact with your medicine. What should I watch for while using this medication? Your condition will be monitored carefully while you are receiving this medication. You may need blood work done while taking this medication. This medication may increase your risk of getting an infection. Call your care team for advice if you get a fever, chills, sore throat, or other symptoms of a cold or flu. Do not treat yourself. Try to avoid being around people who are sick. Tell your dentist and dental surgeon that you are taking this medication. You should not have major dental surgery while on this medication. See your dentist to have a dental exam and fix any dental problems before starting this medication. Take good care of your teeth while on this medication. Make sure you see your dentist for regular follow-up appointments. This medication may cause low levels of calcium in your body. The risk of severe side effects is increased in people with kidney disease. Your care team may prescribe calcium and vitamin D to help prevent low calcium levels while you take this medication. It is important to take calcium and vitamin D as directed by your care team. Talk to your care team if you may be pregnant. Serious birth defects may occur if you take this medication during pregnancy and for 5 months after the last dose. You will need a negative pregnancy test before starting this medication. Contraception  is recommended while taking this medication and for 5 months after the last dose. Your care team can help you find the option that works for you. Talk to your care team before breastfeeding. Changes to your treatment plan may be needed. What side effects may I notice from  receiving this medication? Side effects that you should report to your care team as soon as possible: Allergic reactions--skin rash, itching, hives, swelling of the face, lips, tongue, or throat Infection--fever, chills, cough, sore throat, wounds that don't heal, pain or trouble when passing urine, general feeling of discomfort or being unwell Low calcium level--muscle pain or cramps, confusion, tingling, or numbness in the hands or feet Osteonecrosis of the jaw--pain, swelling, or redness in the mouth, numbness of the jaw, poor healing after dental work, unusual discharge from the mouth, visible bones in the mouth Severe bone, joint, or muscle pain Skin infection--skin redness, swelling, warmth, or pain Side effects that usually do not require medical attention (report these to your care team if they continue or are bothersome): Back pain Headache Joint pain Muscle pain Pain in the hands, arms, legs, or feet Runny or stuffy nose Sore throat This list may not describe all possible side effects. Call your doctor for medical advice about side effects. You may report side effects to FDA at 1-800-FDA-1088. Where should I keep my medication? This medication is given in a hospital or clinic. It will not be stored at home. NOTE: This sheet is a summary. It may not cover all possible information. If you have questions about this medicine, talk to your doctor, pharmacist, or health care provider.  2024 Elsevier/Gold Standard (2022-09-28 00:00:00)   Alendronate Tablets What is this medication? ALENDRONATE (a LEN droe nate) prevents and treats osteoporosis. It may also be used to treat Paget disease of the bone. It works by Interior and spatial designer stronger and less likely to break (fracture). It belongs to a group of medications called bisphosphonates. This medicine may be used for other purposes; ask your health care provider or pharmacist if you have questions. COMMON BRAND NAME(S): Fosamax What  should I tell my care team before I take this medication? They need to know if you have any of these conditions: Bleeding disorder Cancer Dental disease Difficulty swallowing Infection (fever, chills, cough, sore throat, pain or trouble passing urine) Kidney disease Low levels of calcium or other minerals in the blood Low red blood cell counts Receiving steroids like dexamethasone or prednisone Stomach or intestine problems Trouble sitting or standing for 30 minutes An unusual or allergic reaction to alendronate, other medications, foods, dyes or preservatives Pregnant or trying to get pregnant Breast-feeding How should I use this medication? Take this medication by mouth with a full glass of water. Take it as directed on the prescription label at the same time every day. Take the dose right after waking up. Do not eat or drink anything before taking it. Do not take it with any other drink except water. Do not chew or crush the tablet. After taking it, do not eat breakfast, drink, or take any other medications or vitamins for at least 30 minutes. Sit or stand up for at least 30 minutes after you take it. Do not lie down. Keep taking it unless your care team tells you to stop. A special MedGuide will be given to you by the pharmacist with each prescription and refill. Be sure to read this information carefully each time. Talk to your care team about the use  of this medication in children. Special care may be needed. Overdosage: If you think you have taken too much of this medicine contact a poison control center or emergency room at once. NOTE: This medicine is only for you. Do not share this medicine with others. What if I miss a dose? If you take your medication once a day, skip it. Take your next dose at the scheduled time the next morning. Do not take two doses on the same day. If you take your medication once a week, take the missed dose on the morning after you remember. Do not take two  doses on the same day. What may interact with this medication? Aluminum hydroxide Antacids Aspirin Calcium supplements Medications for inflammation like ibuprofen, naproxen, and others Iron supplements Magnesium supplements Vitamins with minerals This list may not describe all possible interactions. Give your health care provider a list of all the medicines, herbs, non-prescription drugs, or dietary supplements you use. Also tell them if you smoke, drink alcohol, or use illegal drugs. Some items may interact with your medicine. What should I watch for while using this medication? Visit your care team for regular checks on your progress. It may be some time before you see the benefit from this medication. Some people who take this medication have severe bone, joint, or muscle pain. This medication may also increase your risk for jaw problems or a broken thigh bone. Tell your care team right away if you have severe pain in your jaw, bones, joints, or muscles. Tell you care team if you have any pain that does not go away or that gets worse. Tell your dentist and dental surgeon that you are taking this medication. You should not have major dental surgery while on this medication. See your dentist to have a dental exam and fix any dental problems before starting this medication. Take good care of your teeth while on this medication. Make sure you see your dentist for regular follow-up appointments. You should make sure you get enough calcium and vitamin D while you are taking this medication. Discuss the foods you eat and the vitamins you take with your care team. You may need blood work done while you are taking this medication. What side effects may I notice from receiving this medication? Side effects that you should report to your care team as soon as possible: Allergic reactions--skin rash, itching, hives, swelling of the face, lips, tongue, or throat Low calcium level--muscle pain or cramps,  confusion, tingling, or numbness in the hands or feet Osteonecrosis of the jaw--pain, swelling, or redness in the mouth, numbness of the jaw, poor healing after dental work, unusual discharge from the mouth, visible bones in the mouth Pain or trouble swallowing Severe bone, joint, or muscle pain Stomach bleeding--bloody or black, tar-like stools, vomiting blood or brown material that looks like coffee grounds Side effects that usually do not require medical attention (report to your care team if they continue or are bothersome): Constipation Diarrhea Nausea Stomach pain This list may not describe all possible side effects. Call your doctor for medical advice about side effects. You may report side effects to FDA at 1-800-FDA-1088. Where should I keep my medication? Keep out of the reach of children and pets. Store at room temperature between 15 and 30 degrees C (59 and 86 degrees F). Throw away any unused medication after the expiration date. NOTE: This sheet is a summary. It may not cover all possible information. If you have questions about this  medicine, talk to your doctor, pharmacist, or health care provider.  2024 Elsevier/Gold Standard (2020-09-04 00:00:00)   Osteoporosis  Osteoporosis happens when the bones become thin and less dense than normal. Osteoporosis makes bones more brittle and fragile and more likely to break (fracture). Over time, osteoporosis can cause your bones to become so weak that they fracture after a minor fall. Bones in the hip, wrist, and spine are most likely to fracture due to osteoporosis. What are the causes? The exact cause of this condition is not known. What increases the risk? You are more likely to develop this condition if you: Have family members with this condition. Have poor nutrition. Use the following: Steroid medicines, such as prednisone. Anti-seizure medicines. Nicotine or tobacco, such as cigarettes, e-cigarettes, and chewing  tobacco. Are female. Are age 61 or older. Are not physically active (are sedentary). Are of European or Asian descent. Have a small body frame. What are the signs or symptoms? A fracture might be the first sign of osteoporosis, especially if the fracture results from a fall or injury that usually would not cause a bone to break. Other signs and symptoms include: Pain in the neck or low back. Stooped posture. Loss of height. How is this diagnosed? This condition may be diagnosed based on: Your medical history. A physical exam. A bone mineral density test, also called a DXA or DEXA test (dual-energy X-ray absorptiometry test). This test uses X-rays to measure the amount of minerals in your bones. How is this treated? This condition may be treated by: Making lifestyle changes, such as: Including foods with more calcium and vitamin D in your diet. Doing weight-bearing and muscle-strengthening exercises. Stopping tobacco use. Limiting alcohol intake. Taking medicine to slow the process of bone loss or to increase bone density. Taking daily supplements of calcium and vitamin D. Taking hormone replacement medicines, such as estrogen for women and testosterone for men. Monitoring your levels of calcium and vitamin D. The goal of treatment is to strengthen your bones and lower your risk for a fracture. Follow these instructions at home: Eating and drinking Include calcium and vitamin D in your diet. Calcium is important for bone health, and vitamin D helps your body absorb calcium. Good sources of calcium and vitamin D include: Certain fatty fish, such as salmon and tuna. Products that have calcium and vitamin D added to them (are fortified), such as fortified cereals. Egg yolks. Cheese. Liver.  Activity Do exercises as told by your health care provider. Ask your health care provider what exercises and activities are safe for you. You should do: Exercises that make you work against  gravity (weight-bearing exercises), such as tai chi, yoga, or walking. Exercises to strengthen muscles, such as lifting weights. Lifestyle Do not drink alcohol if: Your health care provider tells you not to drink. You are pregnant, may be pregnant, or are planning to become pregnant. If you drink alcohol: Limit how much you use to: 0-1 drink a day for women. 0-2 drinks a day for men. Know how much alcohol is in your drink. In the [place] one drink equals one 12 oz bottle of beer (355 mL), one 5 oz glass of wine (148 mL), or one 1 oz glass of hard liquor (44 mL). Do not use any products that contain nicotine or tobacco, such as cigarettes, e-cigarettes, and chewing tobacco. If you need help quitting, ask your health care provider. Preventing falls Use devices to help you move around (mobility aids) as needed, such  as canes, walkers, scooters, or crutches. Keep rooms well-lit and clutter-free. Remove tripping hazards from walkways, including cords and throw rugs. Install grab bars in bathrooms and safety rails on stairs. Use rubber mats in the bathroom and other areas that are often wet or slippery. Wear closed-toe shoes that fit well and support your feet. Wear shoes that have rubber soles or low heels. Review your medicines with your health care provider. Some medicines can cause dizziness or changes in blood pressure, which can increase your risk of falling. General instructions Take over-the-counter and prescription medicines only as told by your health care provider. Keep all follow-up visits. This is important. Contact a health care provider if: You have never been screened for osteoporosis and you are: A woman who is age 60 or older. A man who is age 82 or older. Get help right away if: You fall or injure yourself. Summary Osteoporosis is thinning and loss of density in your bones. This makes bones more brittle and fragile and more likely to break (fracture),even with minor  falls. The goal of treatment is to strengthen your bones and lower your risk for a fracture. Include calcium and vitamin D in your diet. Calcium is important for bone health, and vitamin D helps your body absorb calcium. Talk with your health care provider about screening for osteoporosis if you are a woman who is age 24 or older, or a man who is age 74 or older. This information is not intended to replace advice given to you by your health care provider. Make sure you discuss any questions you have with your health care provider. Document Revised: 02/07/2020 Document Reviewed: 02/07/2020 Elsevier Patient Education  2024 [employer] Inc.   EXERCISE AND DIET:  We recommended that you start or continue a regular exercise program for good health. Regular exercise means any activity that makes your heart beat faster and makes you sweat.  We recommend exercising at least 30 minutes per day at least 3 days a week, preferably 4 or 5.  We also recommend a diet low in fat and sugar.  Inactivity, poor dietary choices and obesity can cause diabetes, heart attack, stroke, and kidney damage, among others.    ALCOHOL AND SMOKING:  Women should limit their alcohol intake to no more than 7 drinks/beers/glasses of wine (combined, not each!) per week. Moderation of alcohol intake to this level decreases your risk of breast cancer and liver damage. And of course, no recreational drugs are part of a healthy lifestyle.  And absolutely no smoking or even second hand smoke. Most people know smoking can cause heart and lung diseases, but did you know it also contributes to weakening of your bones? Aging of your skin?  Yellowing of your teeth and nails?  CALCIUM AND VITAMIN D:  Adequate intake of calcium and Vitamin D are recommended.  The recommendations for exact amounts of these supplements seem to change often, but generally speaking 600 mg of calcium (either carbonate or citrate) and 800 units of Vitamin D per day seems  prudent. Certain women may benefit from higher intake of Vitamin D.  If you are among these women, your doctor will have told you during your visit.    PAP SMEARS:  Pap smears, to check for cervical cancer or precancers,  have traditionally been done yearly, although recent scientific advances have shown that most women can have pap smears less often.  However, every woman still should have a physical exam from her gynecologist every year.  It will include a breast check, inspection of the vulva and vagina to check for abnormal growths or skin changes, a visual exam of the cervix, and then an exam to evaluate the size and shape of the uterus and ovaries.  And after 62 years of age, a rectal exam is indicated to check for rectal cancers. We will also provide age appropriate advice regarding health maintenance, like when you should have certain vaccines, screening for sexually transmitted diseases, bone density testing, colonoscopy, mammograms, etc.   MAMMOGRAMS:  All women over 20 years old should have a yearly mammogram. Many facilities now offer a "3D" mammogram, which may cost around $50 extra out of pocket. If possible,  we recommend you accept the option to have the 3D mammogram performed.  It both reduces the number of women who will be called back for extra views which then turn out to be normal, and it is better than the routine mammogram at detecting truly abnormal areas.    COLONOSCOPY:  Colonoscopy to screen for colon cancer is recommended for all women at age 69.  We know, you hate the idea of the prep.  We agree, BUT, having colon cancer and not knowing it is worse!!  Colon cancer so often starts as a polyp that can be seen and removed at colonscopy, which can quite literally save your life!  And if your first colonoscopy is normal and you have no family history of colon cancer, most women don't have to have it again for 10 years.  Once every ten years, you can do something that may end up saving  your life, right?  We will be happy to help you get it scheduled when you are ready.  Be sure to check your insurance coverage so you understand how much it will cost.  It may be covered as a preventative service at no cost, but you should check your particular policy.

## 2024-04-05 LAB — HM MAMMOGRAPHY

## 2024-04-12 ENCOUNTER — Encounter: Payer: Self-pay | Admitting: Family Medicine

## 2024-04-16 ENCOUNTER — Telehealth: Payer: Self-pay | Admitting: Family Medicine

## 2024-04-16 NOTE — Telephone Encounter (Signed)
 Outbound call placed to schedule initial visit for patient to establish care; patient following Dr. Aletha from [facility].  Left message to return call.

## 2024-06-28 ENCOUNTER — Ambulatory Visit: Payer: 59 | Admitting: Obstetrics and Gynecology

## 2024-08-13 ENCOUNTER — Encounter: Payer: Self-pay | Admitting: Family Medicine

## 2024-08-13 ENCOUNTER — Ambulatory Visit: Admitting: Family Medicine

## 2024-08-13 VITALS — BP 124/84 | HR 69 | Temp 98.8°F | Ht 65.0 in | Wt 148.4 lb

## 2024-08-13 DIAGNOSIS — Z7689 Persons encountering health services in other specified circumstances: Secondary | ICD-10-CM

## 2024-08-13 DIAGNOSIS — M810 Age-related osteoporosis without current pathological fracture: Secondary | ICD-10-CM

## 2024-08-13 DIAGNOSIS — Z23 Encounter for immunization: Secondary | ICD-10-CM

## 2024-08-13 DIAGNOSIS — R7303 Prediabetes: Secondary | ICD-10-CM

## 2024-08-13 DIAGNOSIS — E785 Hyperlipidemia, unspecified: Secondary | ICD-10-CM

## 2024-08-13 NOTE — Progress Notes (Signed)
 Patient Office Visit  Assessment & Plan:  Encounter to establish care  Hyperlipidemia, unspecified hyperlipidemia type -     CBC with Differential/Platelet -     Comprehensive metabolic panel with GFR -     Lipid panel  Prediabetes -     Hemoglobin A1c  Osteoporosis, unspecified osteoporosis type, unspecified pathological fracture presence -     DG Bone Density; Future  Need for pneumococcal 20-valent conjugate vaccination -     Pneumococcal conjugate vaccine 20-valent   Assessment and Plan    Age-related osteoporosis Osteoporosis with alendronate intolerance due to severe headaches. Evaluating alternative treatments based on bone density results. Consider Prolia shots if DEXA worse - Ordered bone density test. - Consider osteoporosis injection if bone density worsened.  Prediabetes Concern about prediabetes despite healthy diet. Last blood work was in November last year. - Ordered blood work to assess prediabetes status.  General Health Maintenance Discussed pneumonia vaccination for individuals over 20 years old. - Administered pneumonia vaccination.     Test results were reviewed and analyzed as part of the medical decision making of this visit.  Reviewed previous labs and diagnostic studies done at [facility] during the office visit.  Pneumococcal vaccine given today.  Return for physical next time.  Patient will come back Friday for labs.  DEXA scan ordered at [facility]. Recommend healthy diet i.e mediterranean/DASH diet, consistent exercise - 30 minutes 5 day per week, and gradual weight loss.  Return if symptoms worsen or fail to improve, for physical.   Subjective:    Patient ID: Loretta Hodges, female    DOB: April 23, 1961  Age: 63 y.o. MRN: [id number]  Chief Complaint  Patient presents with   Medical Management of Chronic Issues   Establish Care    HPI Discussed the use of AI scribe software for clinical note transcription with the patient, who gave  verbal consent to proceed.  History of Present Illness        History of Present Illness Loretta Hodges is a 63 year old female with osteoporosis who presents with concerns about medication side effects and bone health, follow up hyperlipidemia and prediabetes  She experiences significant side effects from alendronate, including severe headaches and an inability to function over the weekend after taking the medication. Consequently, she discontinued its use after the first dose (this was last year). Patient interested in doing another DEXA scan and if still unchanged she would not take medication but if worse she would consider Prolia shots.   She has a history of early menopause at age 61. She has been taking calcium supplements at a higher dose. patient does do a lot of walking/hiking/weight bearing  She is concerned about her bone density and mentions a previous bone density test, though she is unsure of the exact location where it was performed.  She has a history of prediabetes and is mindful of her diet, although she is uncertain about why she has this condition despite eating healthily.  She has been traveling frequently, including trips to [place] , [place] , and [place]  which she enjoyed. She notes that she has been walking a lot during her travels, which she believes has helped her maintain her weight.  She has received her flu shot on November 30th and is considering additional vaccinations, such as the pneumonia shot, given her age.  Assessment and Plan Age-related osteoporosis Osteoporosis with alendronate intolerance due to severe headaches. Evaluating alternative treatments based on bone density results. Consider Prolia shots if  DEXA worse - Ordered bone density test. - Consider osteoporosis injection if bone density worsened.  Prediabetes Concern about prediabetes despite healthy diet. Last blood work was in November last year. - Ordered blood work to assess prediabetes  status.  General Health Maintenance Discussed pneumonia vaccination for individuals over 24 years old. - Administered pneumonia vaccination.    The 10-year ASCVD risk score (Arnett DK, et al., 2019) is: 3.4%  Past Medical History:  Diagnosis Date   Elevated hemoglobin A1c    GERD (gastroesophageal reflux disease)    Glaucoma    H/O cervical polypectomy    History of kidney stones    Hyperlipidemia    Kidney stone    Past Surgical History:  Procedure Laterality Date   FRACTURE SURGERY     ORIF ULNAR FRACTURE Left 10/21/2012   Procedure: OPEN REDUCTION INTERNAL FIXATION left elbow medial epicondyle and ulnar nerve release ;  Surgeon: Elsie Mussel, MD;  Location: [facility] OR;  Service: Orthopedics;  Laterality: Left;  OPEN REDUCTION INTERNAL FIXATION left elbow medial epicondyle and ulnar nerve release    Social History   Tobacco Use   Smoking status: Never   Smokeless tobacco: Never  Vaping Use   Vaping status: Never Used  Substance Use Topics   Alcohol use: Yes    Alcohol/week: 2.0 standard drinks of alcohol    Types: 2 Glasses of wine per week    Comment: occ   Drug use: Never   Family History  Problem Relation Age of Onset   Hypertension Father    Stroke Father    Heart disease Father    Diabetes type II Brother    Diabetes type II Brother    Ovarian cancer Maternal Grandmother 62       dec ovarian ca   Diabetes Paternal Grandfather    Breast cancer Neg Hx    Colon cancer Neg Hx   No Known Allergies  ROS    Objective:    BP 124/84   Pulse 69   Temp 98.8 F (37.1 C)   Ht 5' 5 (1.651 m)   Wt 148 lb 6 oz (67.3 kg)   LMP 10/01/2006 (Exact Date)   SpO2 98%   BMI 24.69 kg/m  BP Readings from Last 3 Encounters:  08/13/24 124/84  06/28/23 128/84  01/15/22 122/80   Wt Readings from Last 3 Encounters:  08/13/24 148 lb 6 oz (67.3 kg)  06/28/23 144 lb (65.3 kg)  01/15/22 148 lb (67.1 kg)    Physical Exam Vitals and nursing note reviewed.   Constitutional:      General: She is not in acute distress.    Appearance: Normal appearance.  HENT:     Head: Normocephalic.     Right Ear: Tympanic membrane, ear canal and external ear normal.     Left Ear: Tympanic membrane, ear canal and external ear normal.  Eyes:     Extraocular Movements: Extraocular movements intact.     Conjunctiva/sclera: Conjunctivae normal.     Pupils: Pupils are equal, round, and reactive to light.  Cardiovascular:     Rate and Rhythm: Normal rate and regular rhythm.     Heart sounds: Normal heart sounds.  Pulmonary:     Effort: Pulmonary effort is normal.     Breath sounds: Normal breath sounds.  Musculoskeletal:     Right lower leg: No edema.     Left lower leg: No edema.  Neurological:     General: No focal deficit present.  Mental Status: She is alert and oriented to person, place, and time.  Psychiatric:        Mood and Affect: Mood normal.        Behavior: Behavior normal.        Thought Content: Thought content normal.        Judgment: Judgment normal.      No results found for any visits on 08/13/24.

## 2024-08-16 ENCOUNTER — Other Ambulatory Visit

## 2024-08-16 LAB — CBC WITH DIFFERENTIAL/PLATELET
Absolute Lymphocytes: 1659 {cells}/uL (ref 850–3900)
Absolute Monocytes: 428 {cells}/uL (ref 200–950)
Basophils Absolute: 38 {cells}/uL (ref 0–200)
Basophils Relative: 0.8 %
Eosinophils Absolute: 160 {cells}/uL (ref 15–500)
Eosinophils Relative: 3.4 %
HCT: 40.8 % (ref 35.9–46.0)
Hemoglobin: 13.4 g/dL (ref 11.7–15.5)
MCH: 29.3 pg (ref 27.0–33.0)
MCHC: 32.8 g/dL (ref 31.6–35.4)
MCV: 89.3 fL (ref 81.4–101.7)
MPV: 10.6 fL (ref 7.5–12.5)
Monocytes Relative: 9.1 %
Neutro Abs: 2416 {cells}/uL (ref 1500–7800)
Neutrophils Relative %: 51.4 %
Platelets: 214 Thousand/uL (ref 140–400)
RBC: 4.57 Million/uL (ref 3.80–5.10)
RDW: 12.6 % (ref 11.0–15.0)
Total Lymphocyte: 35.3 %
WBC: 4.7 Thousand/uL (ref 3.8–10.8)

## 2024-08-16 LAB — LIPID PANEL
Cholesterol: 219 mg/dL — ABNORMAL HIGH (ref <200)
HDL: 88 mg/dL (ref >=50)
LDL Cholesterol (Calc): 117 mg/dL — ABNORMAL HIGH
Non-HDL Cholesterol (Calc): 131 mg/dL — ABNORMAL HIGH (ref <130)
Total CHOL/HDL Ratio: 2.5 (calc) (ref <5.0)
Triglycerides: 53 mg/dL (ref <150)

## 2024-08-16 LAB — COMPREHENSIVE METABOLIC PANEL WITH GFR
AG Ratio: 2 (calc) (ref 1.0–2.5)
ALT: 11 U/L (ref 6–29)
AST: 16 U/L (ref 10–35)
Albumin: 4.4 g/dL (ref 3.6–5.1)
Alkaline phosphatase (APISO): 39 U/L (ref 37–153)
BUN: 19 mg/dL (ref 7–25)
CO2: 29 mmol/L (ref 20–32)
Calcium: 9.2 mg/dL (ref 8.6–10.4)
Chloride: 103 mmol/L (ref 98–110)
Creat: 0.84 mg/dL (ref 0.50–1.05)
Globulin: 2.2 g/dL (ref 1.9–3.7)
Glucose, Bld: 90 mg/dL (ref 65–99)
Potassium: 4.8 mmol/L (ref 3.5–5.3)
Sodium: 139 mmol/L (ref 135–146)
Total Bilirubin: 0.5 mg/dL (ref 0.2–1.2)
Total Protein: 6.6 g/dL (ref 6.1–8.1)
eGFR: 78 mL/min/1.73m2 (ref >=60)

## 2024-08-16 LAB — HEMOGLOBIN A1C
Hgb A1c MFr Bld: 5.8 % — ABNORMAL HIGH (ref <5.7)
Mean Plasma Glucose: 120 mg/dL
eAG (mmol/L): 6.6 mmol/L

## 2024-08-17 ENCOUNTER — Other Ambulatory Visit: Payer: Self-pay

## 2024-08-17 ENCOUNTER — Ambulatory Visit: Payer: Self-pay | Admitting: Family Medicine

## 2024-08-17 ENCOUNTER — Telehealth: Payer: Self-pay

## 2024-08-17 MED ORDER — ROSUVASTATIN CALCIUM 10 MG PO TABS: 10.0000 mg | ORAL_TABLET | Freq: Every day | ORAL | 1 refills | Status: AC

## 2024-08-17 NOTE — Telephone Encounter (Signed)
 Noted. Med sent to [facility].

## 2024-08-17 NOTE — Progress Notes (Signed)
 See note from [facility] CRM.

## 2024-08-17 NOTE — Telephone Encounter (Signed)
 Copied from CRM #[id number]. Topic: Clinical - Medication Question >> Aug 17, 2024 11:09 AM [name] wrote: Reason for CRM: Pt calling for update on simvastatin  refill. I notified her of note on her labs and the note on mychart left for her regarding changing to crestor. She is okay with this change and would like it sent to:  [facility] [id number] - [place], [place] - [address] [place] [address] Phone: [phone] Fax: [phone]

## 2024-09-26 ENCOUNTER — Telehealth: Payer: Self-pay

## 2024-09-26 NOTE — Telephone Encounter (Signed)
 Copied from CRM #[id number]. Topic: Clinical - Request for Lab/Test Order >> Sep 26, 2024 11:28 AM Wess RAMAN wrote: Reason for CRM: Patient needs lab orders placed for upcoming physical in June.   Callback #: [id number]

## 2024-10-17 ENCOUNTER — Encounter: Admitting: Family Medicine

## 2024-10-23 ENCOUNTER — Ambulatory Visit: Admitting: Obstetrics and Gynecology

## 2025-02-05 ENCOUNTER — Encounter: Admitting: Family Medicine
